# Patient Record
Sex: Female | Born: 1975 | Race: White | Hispanic: No | Marital: Married | Smoking: Never smoker
Health system: Southern US, Community
[De-identification: ages and names within clinical notes are randomized; demographics above are authoritative.]

## PROBLEM LIST (undated history)

## (undated) ENCOUNTER — Inpatient Hospital Stay (HOSPITAL_COMMUNITY): Payer: PRIVATE HEALTH INSURANCE

## (undated) ENCOUNTER — Inpatient Hospital Stay (HOSPITAL_COMMUNITY): Payer: Self-pay

## (undated) DIAGNOSIS — R011 Cardiac murmur, unspecified: Secondary | ICD-10-CM

## (undated) DIAGNOSIS — O3660X0 Maternal care for excessive fetal growth, unspecified trimester, not applicable or unspecified: Secondary | ICD-10-CM

## (undated) DIAGNOSIS — N200 Calculus of kidney: Secondary | ICD-10-CM

## (undated) DIAGNOSIS — B999 Unspecified infectious disease: Secondary | ICD-10-CM

## (undated) HISTORY — DX: Unspecified infectious disease: B99.9

## (undated) HISTORY — PX: WISDOM TOOTH EXTRACTION: SHX21

## (undated) HISTORY — DX: Cardiac murmur, unspecified: R01.1

---

## 1997-05-10 DIAGNOSIS — R011 Cardiac murmur, unspecified: Secondary | ICD-10-CM

## 1997-05-10 HISTORY — DX: Cardiac murmur, unspecified: R01.1

## 2010-10-09 ENCOUNTER — Inpatient Hospital Stay (HOSPITAL_COMMUNITY): Admission: AD | Admit: 2010-10-09 | Payer: Self-pay | Source: Home / Self Care | Admitting: Obstetrics and Gynecology

## 2010-10-14 ENCOUNTER — Inpatient Hospital Stay (HOSPITAL_COMMUNITY)
Admission: AD | Admit: 2010-10-14 | Discharge: 2010-10-16 | DRG: 373 | Disposition: A | Discharge status: Home / Self Care | Payer: BC Managed Care – PPO | Source: Ambulatory Visit | Attending: Obstetrics and Gynecology | Admitting: Obstetrics and Gynecology

## 2010-10-14 DIAGNOSIS — D649 Anemia, unspecified: Secondary | ICD-10-CM | POA: Diagnosis not present

## 2010-10-14 DIAGNOSIS — O9903 Anemia complicating the puerperium: Secondary | ICD-10-CM | POA: Diagnosis not present

## 2010-10-15 LAB — CBC
Platelets: 226 10*3/uL (ref 150–400)
RBC: 3.31 MIL/uL — ABNORMAL LOW (ref 3.87–5.11)
RDW: 14.3 % (ref 11.5–15.5)
WBC: 10.9 10*3/uL — ABNORMAL HIGH (ref 4.0–10.5)

## 2010-10-15 LAB — RPR: RPR Ser Ql: NONREACTIVE

## 2010-10-16 LAB — CBC
Hemoglobin: 8.8 g/dL — ABNORMAL LOW (ref 12.0–15.0)
MCHC: 32.2 g/dL (ref 30.0–36.0)
Platelets: 220 10*3/uL (ref 150–400)
RBC: 2.86 MIL/uL — ABNORMAL LOW (ref 3.87–5.11)

## 2012-01-18 ENCOUNTER — Ambulatory Visit: Payer: Self-pay | Admitting: Obstetrics and Gynecology

## 2012-01-31 ENCOUNTER — Ambulatory Visit: Payer: Self-pay | Admitting: Obstetrics and Gynecology

## 2012-02-28 ENCOUNTER — Ambulatory Visit (INDEPENDENT_AMBULATORY_CARE_PROVIDER_SITE_OTHER): Payer: BC Managed Care – PPO | Admitting: Obstetrics and Gynecology

## 2012-02-28 ENCOUNTER — Other Ambulatory Visit: Payer: Self-pay

## 2012-02-28 ENCOUNTER — Telehealth: Payer: Self-pay | Admitting: Obstetrics and Gynecology

## 2012-02-28 ENCOUNTER — Other Ambulatory Visit: Payer: Self-pay | Admitting: Obstetrics and Gynecology

## 2012-02-28 ENCOUNTER — Encounter: Payer: Self-pay | Admitting: Obstetrics and Gynecology

## 2012-02-28 VITALS — BP 110/68 | Ht 67.0 in | Wt 163.0 lb

## 2012-02-28 DIAGNOSIS — Z0000 Encounter for general adult medical examination without abnormal findings: Secondary | ICD-10-CM

## 2012-02-28 DIAGNOSIS — Z331 Pregnant state, incidental: Secondary | ICD-10-CM

## 2012-02-28 DIAGNOSIS — O09529 Supervision of elderly multigravida, unspecified trimester: Secondary | ICD-10-CM

## 2012-02-28 DIAGNOSIS — B373 Candidiasis of vulva and vagina: Secondary | ICD-10-CM

## 2012-02-28 DIAGNOSIS — Z01419 Encounter for gynecological examination (general) (routine) without abnormal findings: Secondary | ICD-10-CM

## 2012-02-28 DIAGNOSIS — B3731 Acute candidiasis of vulva and vagina: Secondary | ICD-10-CM

## 2012-02-28 MED ORDER — TERCONAZOLE 0.4 % VA CREA: 1.0000 | TOPICAL_CREAM | Freq: Every day | VAGINAL | Status: DC

## 2012-02-28 NOTE — Progress Notes (Signed)
Last Pap:  Over 2 years ago per pt WNL: Yes Regular Periods:yes Contraception: pregnant   Monthly Breast exam:yes Tetanus<70yrs:no Nl.Bladder Function:yes Daily BMs:yes Healthy Diet:yes Calcium:yes Mammogram:no Exercise:yes Have often Exercise: 3 times a week  Seatbelt: yes Abuse at home: no Stressful work:no  Bone Density: No PCP: urgent care  Change in PMH: unchanged  Change in ZOX:[id number]

## 2012-02-28 NOTE — Progress Notes (Signed)
Subjective:    Marie Woodward is a 36 y.o. female, G3P2002, who presents for an annual exam. Planning pg soon hx of IVF x 2 now with +UPT with LMP certain 8/30, taking PNV, without N,V, or breast tenderness.    History   Social History  . Marital Status: Married    Spouse Name: N/A    Number of Children: N/A  . Years of Education: N/A   Social History Main Topics  . Smoking status: Never Smoker   . Smokeless tobacco: Never Used  . Alcohol Use: No  . Drug Use: No  . Sexually Active: Yes    Birth Control/ Protection: None     pregnant    Other Topics Concern  . None   Social History Narrative  . None    Menstrual cycle:   LMP:8/30 certain  The following portions of the patient's history were reviewed and updated as appropriate: allergies, current medications, past family history, past medical history, past social history, past surgical history and problem list.  Review of Systems Pertinent items are noted in HPI. Breast:Negative for breast lump,nipple discharge or nipple retraction Gastrointestinal: Negative for abdominal pain, change in bowel habits or rectal bleeding Urinary:negative   Objective:    BP 110/68  Ht 5\' 7"  (1.702 m)  Wt 163 lb (73.936 kg)  BMI 25.53 kg/m2  LMP 01/07/2012    Weight:  Wt Readings from Last 1 Encounters:  02/28/12 163 lb (73.936 kg)          BMI: Body mass index is 25.53 kg/(m^2).  General Appearance: Alert, appropriate appearance for age. No acute distress HEENT: Grossly normal Neck / Thyroid: Supple, no masses, nodes or enlargement Lungs: clear to auscultation bilaterally Back: No CVA tenderness Breast Exam: No masses or nodes.No dimpling, nipple retraction or discharge Cardiovascular: Regular rate and rhythm. Gastrointestinal: Soft, non-tender, no masses or organomegaly Pelvic Exam: EGBUS WNL uterus 6 week size neg CMT no adenexal masses  Rectovaginal: rectum visible wnl Lymphatic Exam: Non-palpable nodes in neck, clavicular,  axillary, or inguinal regions Skin: no rash or abnormalities Neurologic: Normal gait and speech, no tremor  Psychiatric: Alert and oriented, appropriate affect.   Wet Prep:positive hyphae, neg clue, neg trich Urinalysis:not applicable UPT: Positive, Not done   Assessment:    Normal gyn exam  6 week pg   VVC  Plan:  pap smear return for new ob visit with RN STD screening: gc/chl today Declines genetic testing discussed Ama  Lavera Guise, CNM

## 2012-02-28 NOTE — Telephone Encounter (Signed)
Returned pt's call. Informed of MK (per DR SR) recommendation of beginning progesterone supp.  Pt prefers to wait for results of lab.  Sched 03/01/12.

## 2012-02-28 NOTE — Telephone Encounter (Signed)
TC to pt. LM to return call. Per MK, Rx for Progesterone 25 mg 1 pv BID #14, 3 RF to be ordered. Will notify pt if needs to continue after progesterone lab result is available.

## 2012-02-29 LAB — PAP IG, CT-NG, RFX HPV ASCU: Chlamydia Probe Amp: NEGATIVE

## 2012-03-01 ENCOUNTER — Other Ambulatory Visit: Payer: BC Managed Care – PPO

## 2012-03-01 DIAGNOSIS — Z331 Pregnant state, incidental: Secondary | ICD-10-CM

## 2012-03-08 ENCOUNTER — Telehealth: Payer: Self-pay | Admitting: Obstetrics and Gynecology

## 2012-03-08 NOTE — Telephone Encounter (Signed)
TC to pt. LM to return call.  

## 2012-03-08 NOTE — Telephone Encounter (Signed)
Message copied by Mason Jim on Wed Mar 08, 2012 10:38 AM ------      Message from: [facility], Corrie Dandy      Created: Wed Mar 08, 2012  8:47 AM      Regarding: call pt progestrone level is above 15 will not need to use progesterone suppositories       Thanks, Corrie Dandy

## 2012-03-08 NOTE — Telephone Encounter (Signed)
TC from pt.  Informed per [facility] Progesterone level above 15.  Does not need Prog supp. Pt verbalizes comprehension.

## 2012-04-11 ENCOUNTER — Ambulatory Visit (INDEPENDENT_AMBULATORY_CARE_PROVIDER_SITE_OTHER): Payer: BC Managed Care – PPO | Admitting: Obstetrics and Gynecology

## 2012-04-11 DIAGNOSIS — Z331 Pregnant state, incidental: Secondary | ICD-10-CM

## 2012-04-11 LAB — POCT URINALYSIS DIPSTICK
Bilirubin, UA: NEGATIVE
Blood, UA: NEGATIVE
Glucose, UA: NEGATIVE
Ketones, UA: NEGATIVE
Nitrite, UA: NEGATIVE
Spec Grav, UA: <=1.005
pH, UA: 8

## 2012-04-11 NOTE — Progress Notes (Signed)
NOB interview.  Declines genetic testing.

## 2012-04-13 LAB — CULTURE, OB URINE: Colony Count: 60000

## 2012-04-14 LAB — PRENATAL PANEL VII
Antibody Screen: NEGATIVE
Eosinophils Absolute: 0 10*3/uL (ref 0.0–0.7)
Eosinophils Relative: 1 % (ref 0–5)
HCT: 39.3 % (ref 36.0–46.0)
Lymphocytes Relative: 21 % (ref 12–46)
Lymphs Abs: 1.4 10*3/uL (ref 0.7–4.0)
MCH: 31.1 pg (ref 26.0–34.0)
MCV: 94 fL (ref 78.0–100.0)
Monocytes Absolute: 0.4 10*3/uL (ref 0.1–1.0)
Platelets: 247 10*3/uL (ref 150–400)
RBC: 4.18 MIL/uL (ref 3.87–5.11)
Rh Type: POSITIVE
Rubella: 1.64 Index — ABNORMAL HIGH (ref <0.90)
WBC: 6.9 10*3/uL (ref 4.0–10.5)

## 2012-04-17 ENCOUNTER — Ambulatory Visit (INDEPENDENT_AMBULATORY_CARE_PROVIDER_SITE_OTHER): Payer: BC Managed Care – PPO | Admitting: Obstetrics and Gynecology

## 2012-04-17 ENCOUNTER — Encounter: Payer: Self-pay | Admitting: Obstetrics and Gynecology

## 2012-04-17 VITALS — BP 100/58 | Wt 162.5 lb

## 2012-04-17 DIAGNOSIS — Z23 Encounter for immunization: Secondary | ICD-10-CM

## 2012-04-17 DIAGNOSIS — B951 Streptococcus, group B, as the cause of diseases classified elsewhere: Secondary | ICD-10-CM

## 2012-04-17 DIAGNOSIS — O2390 Unspecified genitourinary tract infection in pregnancy, unspecified trimester: Secondary | ICD-10-CM

## 2012-04-17 DIAGNOSIS — O2340 Unspecified infection of urinary tract in pregnancy, unspecified trimester: Secondary | ICD-10-CM

## 2012-04-17 DIAGNOSIS — N390 Urinary tract infection, site not specified: Secondary | ICD-10-CM

## 2012-04-17 DIAGNOSIS — Z331 Pregnant state, incidental: Secondary | ICD-10-CM

## 2012-04-17 MED ORDER — FLUCONAZOLE 150 MG PO TABS: 150.0000 mg | ORAL_TABLET | Freq: Once | ORAL | Status: DC

## 2012-04-17 MED ORDER — PENICILLIN V POTASSIUM 500 MG PO TABS: 500.0000 mg | ORAL_TABLET | Freq: Three times a day (TID) | ORAL | Status: DC

## 2012-04-17 NOTE — Progress Notes (Signed)
[redacted]w[redacted]d Declines genetic screening  Last pap with GC/CT 02/28/12 WNL  Pt will get flu vaccine NV

## 2012-04-20 DIAGNOSIS — Z23 Encounter for immunization: Secondary | ICD-10-CM | POA: Insufficient documentation

## 2012-04-20 NOTE — Progress Notes (Signed)
Patient ID: Marie Woodward, female   DOB: [birth year], 36 y.o.   MRN: [id number] Marie Woodward is a 35 y.o. female, G3P2002, who presents for an annual exam. Planning pg soon hx of IVF x 2 now with +UPT with LMP certain 8/30, taking PNV, without N,V, or breast tenderness Marie Woodward is a 36 y.o. female presenting for new ob visit. Taking PNV. Declines genetic test. [redacted]w[redacted]d   OB History    Grav Para Term Preterm Abortions TAB SAB Ect Mult Living   3 2 2       2     HX SVD 7#14 IVF uncomplicated HX SVD 9#2 IVF water birth uncomplicated Past Medical History  Diagnosis Date  . Heart murmur 1999    HAD CARDIAC EVAL  . Infection     UTI X1  . Infection     OCC YEAST  . Headache     RARE MIGRAINES   Past Surgical History  Procedure Date  . Wisdom tooth extraction    Family History: family history includes Cancer in her paternal grandmother; Depression in her father and paternal grandfather; Diabetes in her father; Early death (age of onset:50) in her mother; Heart disease in her maternal grandfather; Heart disease (age of onset:50) in her mother; Hyperlipidemia in her brother; Hypertension in her mother; and Kidney disease in her father. Social History:  reports that she has never smoked. She has never used smokeless tobacco. She reports that she does not drink alcohol or use illicit drugs.  @ROS @    Blood pressure 100/58, weight 162 lb 8 oz (73.71 kg), last menstrual period 01/07/2012, unknown if currently breastfeeding. Complete PE on 02/2012 WNL  Prenatal labs: ABO, Rh: O/POS/-- (12/03 0916) Antibody: NEG (12/03 0916) Rubella:   RPR: NON REAC (12/03 0916)  HBsAg: NEGATIVE (12/03 0916)  HIV: NON REACTIVE (12/03 0916)    Assessment/Plan: [redacted]w[redacted]d GC/CHL neg/neg WET PREP treated yeast PAP 02/2012  ULTRASOUND none will plan 20 week US anatomy Genetic testing declined UA GBS + TXED will plan tx in labor discussed. Varicella nonimmune. Collaboration with Dr.  Su Hilt. [facility], Marie Woodward 04/20/2012, 4:53 PM Lavera Guise, CNM

## 2012-04-21 ENCOUNTER — Telehealth: Payer: Self-pay | Admitting: Obstetrics and Gynecology

## 2012-04-21 NOTE — Telephone Encounter (Signed)
TC to pt. Informed not immune to varicella.  To avoid contact with anyone who is sick and call ASAP with any known exposure. Pt verbalizes comprehension.

## 2012-04-21 NOTE — Telephone Encounter (Signed)
Message copied by Mason Jim on Fri Apr 21, 2012  9:48 AM ------      Message from: [facility], Corrie Dandy      Created: Thu Apr 20, 2012  5:00 PM      Regarding: varicella nonimmune       Please call varicella nonimmune willl need vaccine can not get vaccine during pregnancy avoid people who are sick.Lavera Guise, CNM

## 2012-05-10 NOTE — L&D Delivery Note (Signed)
Delivery Note At 4:07 PM a viable female was delivered via Vaginal, Spontaneous Delivery by RN in birth tub (Presentation: ; Occiput Anterior).  APGAR: 9, 9; weight 9 lb 3.6 oz (4184 g).   Placenta status: Intact, Spontaneous.  Cord: 3 vessels with the following complications: None.  Cord pH: not collected  Anesthesia: Local  Episiotomy: None Lacerations: 1st degree Suture Repair: 3.0 vicryl Est. Blood Loss (mL): 200  Mom to postpartum.  Baby to skin to skin immediately after delivery.  Haroldine Laws 10/05/2012, 6:15 PM

## 2012-05-22 ENCOUNTER — Encounter: Payer: Self-pay | Admitting: Obstetrics and Gynecology

## 2012-05-22 ENCOUNTER — Ambulatory Visit (INDEPENDENT_AMBULATORY_CARE_PROVIDER_SITE_OTHER): Payer: BC Managed Care – PPO | Admitting: Obstetrics and Gynecology

## 2012-05-22 VITALS — BP 98/52 | Wt 168.0 lb

## 2012-05-22 DIAGNOSIS — Z331 Pregnant state, incidental: Secondary | ICD-10-CM

## 2012-05-22 DIAGNOSIS — Z3689 Encounter for other specified antenatal screening: Secondary | ICD-10-CM

## 2012-05-22 DIAGNOSIS — Z3490 Encounter for supervision of normal pregnancy, unspecified, unspecified trimester: Secondary | ICD-10-CM

## 2012-05-22 NOTE — Progress Notes (Signed)
Doing well. Need anatomy US in 1-2 weeks.   Plans another waterbirth, deciding regarding use of doula (likely will use Viviana Simpler again). Declined genetic screening. Check urine culture TOC NV.

## 2012-05-22 NOTE — Addendum Note (Signed)
Addended by: Janeece Agee on: 05/22/2012 05:04 PM   Modules accepted: Orders

## 2012-05-22 NOTE — Progress Notes (Signed)
[redacted]w[redacted]d Pt declines flu vaccine. No concerns per pt.

## 2012-05-25 ENCOUNTER — Encounter: Payer: BC Managed Care – PPO | Admitting: Obstetrics and Gynecology

## 2012-05-30 ENCOUNTER — Other Ambulatory Visit: Payer: Self-pay | Admitting: Obstetrics and Gynecology

## 2012-05-30 ENCOUNTER — Ambulatory Visit: Payer: BC Managed Care – PPO | Admitting: Obstetrics and Gynecology

## 2012-05-30 ENCOUNTER — Ambulatory Visit: Payer: BC Managed Care – PPO

## 2012-05-30 ENCOUNTER — Encounter: Payer: Self-pay | Admitting: Obstetrics and Gynecology

## 2012-05-30 VITALS — BP 110/60 | Wt 168.0 lb

## 2012-05-30 DIAGNOSIS — O358XX0 Maternal care for other (suspected) fetal abnormality and damage, not applicable or unspecified: Secondary | ICD-10-CM

## 2012-05-30 DIAGNOSIS — IMO0002 Reserved for concepts with insufficient information to code with codable children: Secondary | ICD-10-CM

## 2012-05-30 DIAGNOSIS — Z3490 Encounter for supervision of normal pregnancy, unspecified, unspecified trimester: Secondary | ICD-10-CM

## 2012-05-30 DIAGNOSIS — Z3689 Encounter for other specified antenatal screening: Secondary | ICD-10-CM

## 2012-05-30 LAB — US OB DETAIL + 14 WK

## 2012-05-30 NOTE — Progress Notes (Signed)
[redacted]w[redacted]d Unable to void again. Pt left sample before u/s non sterile cup. Anatomy Ultrasound shows:  SIUP  S=D     [place] EDD: 10/13/12           AFI: normal fluid (vertical pocket = 3.9 cm)           Cervical length: 3.48 cm           Placenta localization: posterior           Placenta edge: 3.4 com from internal OS - normal            Fetal presentation: breech                    Anatomy survey is normal           Gender : female [place] pregnancy. No anomalies seen. 4 chamber 3VV, RVOT are unseen due to fetal position f/u in 2-3 wks to complete anat.

## 2012-05-30 NOTE — Progress Notes (Signed)
[redacted]w[redacted]d Pt feeling well. C/o hip discomfort - planning to see Chiropractor Dr. Manson Passey in [place] [employer] anatomy scan today -- will f/u at [facility]

## 2012-06-17 ENCOUNTER — Inpatient Hospital Stay (HOSPITAL_COMMUNITY)
Admission: AD | Admit: 2012-06-17 | Discharge: 2012-06-17 | Disposition: A | Discharge status: Home / Self Care | Payer: BC Managed Care – PPO | Source: Ambulatory Visit | Attending: Obstetrics and Gynecology | Admitting: Obstetrics and Gynecology

## 2012-06-17 ENCOUNTER — Encounter (HOSPITAL_COMMUNITY): Payer: Self-pay | Admitting: Obstetrics and Gynecology

## 2012-06-17 ENCOUNTER — Inpatient Hospital Stay (HOSPITAL_COMMUNITY): Payer: BC Managed Care – PPO

## 2012-06-17 DIAGNOSIS — O99891 Other specified diseases and conditions complicating pregnancy: Secondary | ICD-10-CM | POA: Insufficient documentation

## 2012-06-17 DIAGNOSIS — R319 Hematuria, unspecified: Secondary | ICD-10-CM | POA: Insufficient documentation

## 2012-06-17 DIAGNOSIS — R109 Unspecified abdominal pain: Secondary | ICD-10-CM | POA: Insufficient documentation

## 2012-06-17 LAB — URINALYSIS, ROUTINE W REFLEX MICROSCOPIC
Bilirubin Urine: NEGATIVE
Ketones, ur: NEGATIVE mg/dL
Specific Gravity, Urine: <1.005 — ABNORMAL LOW (ref 1.005–1.030)
Urobilinogen, UA: 0.2 mg/dL (ref 0.0–1.0)

## 2012-06-17 LAB — URINE MICROSCOPIC-ADD ON

## 2012-06-17 LAB — WET PREP, GENITAL
Clue Cells Wet Prep HPF POC: NONE SEEN
Yeast Wet Prep HPF POC: NONE SEEN

## 2012-06-17 LAB — OB RESULTS CONSOLE GC/CHLAMYDIA: Chlamydia: NEGATIVE

## 2012-06-17 NOTE — MAU Provider Note (Signed)
History   CSN: [id number]  Arrival date and time: 06/17/12 1411  First Provider Initiated Contact with Patient 06/17/12 1549      Chief Complaint  Patient presents with  . Urinary Tract Infection   HPI Pt is a 36yo G3 P2,0,0,2 who presents at 23.[redacted]wks gestation with c/o possible UTI.  States she began having lower abd cramping as well as lower back pain last night which has intensified this AM.  She also reports pain in her upper thighs.  Beginning this AM, she noted dysuria, frequency and urgency.  Pt denies any UCs, ROM or bldg.  She is noting normal fetal movement.  She denies any fever, nausea, vomiting.  She reports she has been txed x 1 earlier in preg due to GBS bacteruia, but no s/s since and no hx of freq UTIs or renal lithiasis in the past.  Her pregnancy has been otherwise unremarkable. Ranks pain as 3/10 on scale.  OB History   Grav Para Term Preterm Abortions TAB SAB Ect Mult Living   3 2 2       2       Past Medical History  Diagnosis Date  . Heart murmur 1999    HAD CARDIAC EVAL  . Infection     UTI X1  . Infection     OCC YEAST  . Headache     RARE MIGRAINES    Past Surgical History  Procedure Laterality Date  . Wisdom tooth extraction      Family History  Problem Relation Age of Onset  . Heart disease Mother 53  . Early death Mother 41  . Hypertension Mother   . Diabetes Father     type 2   . Depression Father   . Kidney disease Father     STONES  . Heart disease Maternal Grandfather   . Cancer Paternal Grandmother   . Depression Paternal Grandfather     type 2   . Hyperlipidemia Brother     History  Substance Use Topics  . Smoking status: Never Smoker   . Smokeless tobacco: Never Used  . Alcohol Use: No    Allergies: No Known Allergies  Prescriptions prior to admission  Medication Sig Dispense Refill  . acetaminophen (TYLENOL) 500 MG tablet Take 500 mg by mouth every 6 (six) hours as needed for pain (pain).      . Prenatal Vit-Fe  Fumarate-FA (PRENATAL MULTIVITAMIN) TABS Take 1 tablet by mouth daily.        Review of Systems  Constitutional: Negative.   HENT: Negative.   Eyes: Negative.   Respiratory: Negative.   Cardiovascular: Negative.   Gastrointestinal: Negative.   Genitourinary: Positive for dysuria, urgency and frequency.  Musculoskeletal: Negative.   Skin: Negative.   Neurological: Negative.   Endo/Heme/Allergies: Negative.   Psychiatric/Behavioral: Negative.    Physical Exam   Blood pressure 125/71, pulse 74, temperature 98.2 F (36.8 C), temperature source Oral, resp. rate 18, height 5\' 6"  (1.676 m), weight 176 lb (79.833 kg), last menstrual period 01/07/2012.  Physical Exam  Constitutional: She is oriented to person, place, and time. She appears well-developed and well-nourished.  HENT:  Head: Normocephalic and atraumatic.  Right Ear: External ear normal.  Left Ear: External ear normal.  Nose: Nose normal.  Eyes: Conjunctivae are normal. Pupils are equal, round, and reactive to light.  Neck: Normal range of motion. Neck supple. No thyromegaly present.  Cardiovascular: Normal rate, regular rhythm and intact distal pulses.   Murmur heard. Gr  III/VI murmur noted.  Respiratory: Effort normal and breath sounds normal.  GI: Soft. Bowel sounds are normal.  Genitourinary: Uterus normal. Vaginal discharge found.  Ext genitalia, BUS neg.  Mod amt white discharge in vault. Cx visually closed.  Neg CMT.  Ut mobile, NT, soft and c/w gest age. SVE: Closed/Thick/-3.     Musculoskeletal: Normal range of motion.  Neg CVAT bilat  Neurological: She is alert and oriented to person, place, and time. She has normal reflexes.  Skin: Skin is warm and dry.  Psychiatric: She has a normal mood and affect. Her behavior is normal. Thought content normal.   FHTs present with doppler.  Toco without any uterine contractions or uterine irritability noted.   MAU Course  Procedures Results for orders placed during the  hospital encounter of 06/17/12 (from the past 24 hour(s))  URINALYSIS, ROUTINE W REFLEX MICROSCOPIC     Status: Abnormal   Collection Time    06/17/12  2:28 PM      Result Value Range   Color, Urine YELLOW  YELLOW   APPearance CLEAR  CLEAR   Specific Gravity, Urine <1.005 (*) 1.005 - 1.030   pH 7.0  5.0 - 8.0   Glucose, UA NEGATIVE  NEGATIVE mg/dL   Hgb urine dipstick LARGE (*) NEGATIVE   Bilirubin Urine NEGATIVE  NEGATIVE   Ketones, ur NEGATIVE  NEGATIVE mg/dL   Protein, ur NEGATIVE  NEGATIVE mg/dL   Urobilinogen, UA 0.2  0.0 - 1.0 mg/dL   Nitrite NEGATIVE  NEGATIVE   Leukocytes, UA NEGATIVE  NEGATIVE  URINE MICROSCOPIC-ADD ON     Status: Abnormal   Collection Time    06/17/12  2:28 PM      Result Value Range   Squamous Epithelial / LPF FEW (*) RARE   WBC, UA 0-2  <3 WBC/hpf   RBC / HPF 0-2  <3 RBC/hpf   Urine-Other MUCOUS PRESENT    WET PREP, GENITAL     Status: Abnormal   Collection Time    06/17/12  3:55 PM      Result Value Range   Yeast Wet Prep HPF POC NONE SEEN  NONE SEEN   Trich, Wet Prep NONE SEEN  NONE SEEN   Clue Cells Wet Prep HPF POC NONE SEEN  NONE SEEN   WBC, Wet Prep HPF POC TOO NUMEROUS TO COUNT (*) NONE SEEN  FETAL FIBRONECTIN     Status: None   Collection Time    06/17/12  3:55 PM      Result Value Range   Fetal Fibronectin NEGATIVE  NEGATIVE    Assessment and Plan  IUP at 23w 1d Abd pain Microscopic hematuria - suspected renal lithiasis.  Consult with Dr. Su Hilt. Will obtain renal ultrasound. Pt declines any pain medications at present.  Macy Polio O. 06/17/2012, 6:38 PM   Addendum:  Pt returned from ultrasound and reports pain now greatly decreased.  Denies any further dysuria at present. Ultrasound with 7mm suspected calculi in bladder which was no longer present after voiding.  Pt discharged to home with info on renal calculi given. Will RTO as scheduled for next prenatal visit at [facility]. Pt to RTO or call if she has recurrent pain, fever  or other complaints.    Halen Mossbarger O. 06/17/2012, 7:45 PM

## 2012-06-17 NOTE — MAU Note (Signed)
Pt presents with complaints of cramping since 5 am. States she started having pain with urination around noon

## 2012-06-18 ENCOUNTER — Encounter (HOSPITAL_COMMUNITY): Payer: Self-pay | Admitting: Obstetrics and Gynecology

## 2012-06-20 LAB — GC/CHLAMYDIA PROBE AMP
CT Probe RNA: NEGATIVE
GC Probe RNA: NEGATIVE

## 2012-06-27 ENCOUNTER — Ambulatory Visit: Payer: BC Managed Care – PPO

## 2012-06-27 ENCOUNTER — Other Ambulatory Visit: Payer: BC Managed Care – PPO

## 2012-06-27 ENCOUNTER — Ambulatory Visit: Payer: BC Managed Care – PPO | Admitting: Obstetrics and Gynecology

## 2012-06-27 VITALS — BP 110/70 | Wt 174.0 lb

## 2012-06-27 DIAGNOSIS — N200 Calculus of kidney: Secondary | ICD-10-CM | POA: Insufficient documentation

## 2012-06-27 DIAGNOSIS — O358XX0 Maternal care for other (suspected) fetal abnormality and damage, not applicable or unspecified: Secondary | ICD-10-CM

## 2012-06-27 LAB — US OB FOLLOW UP

## 2012-06-27 NOTE — Progress Notes (Signed)
Doing well-seen in MAU on 2/8 for kidney stone.  No further issues, had resolution of previous lower abdominal pain that she had thought was ligament pain, but resolved after passing of stone. [place] today, with completion of anatomy.  Cervical length WNL, growth 62%ile, variable presentation--eventually to vtx. Glucola NV, with Hgb and RPR. Hip discomfort stable.

## 2012-06-27 NOTE — Progress Notes (Signed)
[redacted]w[redacted]d No concerns per pt  Ultrasound shows:  SIUP  S=D     [place] EDD: 10/13/12            AFI: normal fluid AP pocket 5.0 cm           Cervical length: 3.85 cm           Placenta localization: posterior           Fetal presentation: variable           EFW: = 62nd%tile                   Anatomy survey is normal           Gender : female

## 2012-07-02 ENCOUNTER — Encounter (HOSPITAL_COMMUNITY): Payer: Self-pay | Admitting: Obstetrics and Gynecology

## 2012-07-02 ENCOUNTER — Inpatient Hospital Stay (HOSPITAL_COMMUNITY)
Admission: AD | Admit: 2012-07-02 | Discharge: 2012-07-02 | Disposition: A | Discharge status: Home / Self Care | Payer: BC Managed Care – PPO | Source: Ambulatory Visit | Attending: Obstetrics and Gynecology | Admitting: Obstetrics and Gynecology

## 2012-07-02 DIAGNOSIS — M545 Low back pain, unspecified: Secondary | ICD-10-CM | POA: Insufficient documentation

## 2012-07-02 DIAGNOSIS — O26839 Pregnancy related renal disease, unspecified trimester: Secondary | ICD-10-CM | POA: Insufficient documentation

## 2012-07-02 DIAGNOSIS — N200 Calculus of kidney: Secondary | ICD-10-CM | POA: Insufficient documentation

## 2012-07-02 LAB — URINALYSIS, ROUTINE W REFLEX MICROSCOPIC
Bilirubin Urine: NEGATIVE
Specific Gravity, Urine: 1.015 (ref 1.005–1.030)
pH: 7 (ref 5.0–8.0)

## 2012-07-02 LAB — WET PREP, GENITAL: Yeast Wet Prep HPF POC: NONE SEEN

## 2012-07-02 MED ORDER — IBUPROFEN 800 MG PO TABS: 800.0000 mg | ORAL_TABLET | Freq: Once | ORAL | Status: DC

## 2012-07-02 NOTE — MAU Provider Note (Signed)
History   Marie Woodward is a 37y.o. MWF at [redacted]w[redacted]d who called just before lunch w/ CC of what she thought was vaginal bleeding this morning.  Only other c/o's were mild, low back pain since yesterday, and intermittent pelvic/suprapubic pain at times.  Pt reported she had done extra housework yesterday, and initially attributed back pain to that, but started having brown spotting on underwear this morning, and some BRB w/ wiping, which prompted her to call.  Denied abdominal tightening and ctxs.  Denies resp or other GI c/o's w/ exception of significant heartburn (takes TUMS). No recent IC.  Denies fever or chills.  Accompanied by her husband.  No itching or burning and no dysuria, but frequency.  Of significance, pt seen in MAU 06/17/12 for kidney stone and 7mm calculus noted on initial renal u/s in bladder, but rescanned after void and calculus no longer seen.  Pt was thought to have passed stone, and no further f/u indicated. Pt also stated felt better at that time.  .. Patient Active Problem List  Diagnosis  . Annual physical exam  . Normal pregnancy, incidental  . AMA (advanced maternal age) multigravida 35+  . GBS (group B streptococcus) UTI complicating pregnancy  . Need for varicella vaccine  . Kidney stone   CSN: [id number]  Arrival date and time: 07/02/12 1204   None     Chief Complaint  Patient presents with  . Vaginal Bleeding  . Back Pain   HPI  OB History   Grav Para Term Preterm Abortions TAB SAB Ect Mult Living   3 2 2       2       Past Medical History  Diagnosis Date  . Heart murmur 1999    HAD CARDIAC EVAL  . Infection     UTI X1  . Infection     OCC YEAST  . Headache     RARE MIGRAINES    Past Surgical History  Procedure Laterality Date  . Wisdom tooth extraction      Family History  Problem Relation Age of Onset  . Heart disease Mother 77  . Early death Mother 21  . Hypertension Mother   . Diabetes Father     type 2   . Depression Father   .  Kidney disease Father     STONES  . Heart disease Maternal Grandfather   . Cancer Paternal Grandmother   . Diabetes Paternal Grandfather     Type 2  . Hyperlipidemia Brother     History  Substance Use Topics  . Smoking status: Never Smoker   . Smokeless tobacco: Never Used  . Alcohol Use: No    Allergies: No Known Allergies  Prescriptions prior to admission  Medication Sig Dispense Refill  . acetaminophen (TYLENOL) 500 MG tablet Take 500 mg by mouth every 6 (six) hours as needed for pain (pain).      . Prenatal Vit-Fe Fumarate-FA (PRENATAL MULTIVITAMIN) TABS Take 1 tablet by mouth daily.        ROS--see HPI Physical Exam   Blood pressure 119/71, pulse 84, temperature 97.6 F (36.4 C), temperature source Oral, resp. rate 16, height 5\' 6"  (1.676 m), weight 172 lb 9.6 oz (78.291 kg), last menstrual period 01/07/2012, unknown if currently breastfeeding.  Physical Exam  Constitutional: She is oriented to person, place, and time. She appears well-developed and well-nourished. No distress.  HENT:  Head: Normocephalic and atraumatic.  Eyes: Pupils are equal, round, and reactive to light.  Cardiovascular:  Normal rate.   Respiratory: Effort normal.  GI: Soft.  Gravid; no ctxs palpated; soft, NT  Genitourinary:  Inspection: small amt of watery BRB noted at urethral meatus and periurethral area; small amt also noted at introitus and lesser amt on vulva. SSE: no blood in vault or at os.  Small amt of white, nonodorous d/c in vagina.  No lesions. Cx: closed/long/high  Musculoskeletal: She exhibits no edema.  Neurological: She is alert and oriented to person, place, and time.  Skin: Skin is warm and dry.  Psychiatric: She has a normal mood and affect. Her behavior is normal. Judgment and thought content normal.  .. Results for orders placed during the hospital encounter of 07/02/12 (from the past 24 hour(s))  URINALYSIS, ROUTINE W REFLEX MICROSCOPIC     Status: Abnormal    Collection Time    07/02/12 12:22 PM      Result Value Range   Color, Urine YELLOW  YELLOW   APPearance HAZY (*) CLEAR   Specific Gravity, Urine 1.015  1.005 - 1.030   pH 7.0  5.0 - 8.0   Glucose, UA NEGATIVE  NEGATIVE mg/dL   Hgb urine dipstick LARGE (*) NEGATIVE   Bilirubin Urine NEGATIVE  NEGATIVE   Ketones, ur NEGATIVE  NEGATIVE mg/dL   Protein, ur NEGATIVE  NEGATIVE mg/dL   Urobilinogen, UA 0.2  0.0 - 1.0 mg/dL   Nitrite NEGATIVE  NEGATIVE   Leukocytes, UA TRACE (*) NEGATIVE  URINE MICROSCOPIC-ADD ON     Status: Abnormal   Collection Time    07/02/12 12:22 PM      Result Value Range   Squamous Epithelial / LPF FEW (*) RARE   WBC, UA 3-6  <3 WBC/hpf   RBC / HPF 11-20  <3 RBC/hpf   Bacteria, UA RARE  RARE  WET PREP, GENITAL     Status: Abnormal   Collection Time    07/02/12  1:10 PM      Result Value Range   Yeast Wet Prep HPF POC NONE SEEN  NONE SEEN   Trich, Wet Prep NONE SEEN  NONE SEEN   Clue Cells Wet Prep HPF POC NONE SEEN  NONE SEEN   WBC, Wet Prep HPF POC FEW (*) NONE SEEN    MAU Course  Procedures 1. U/a 2. Urine cx 3. NST: FHR 135, one 15x15 accel, moderate variability, no decels; TOCO: no apparent ctxs 4. SSE w/ wet prep  Assessment and Plan  1. [redacted]w[redacted]d 2. Presumptive kidney stone (primary vs recurrent) 3. FHT appropriate for GA 4. No s/s of VB, or PTL 5. O pos  1. D/c home per c/w Dr. Estanislado Pandy w/ pyelo precautions and strainer 2. Will have office coordinate urology referral STAT 3. F/u as scheduled at [facility], or prn 4. Rec'd pt take Motrin 600mg  poq 6hrs for mild pain prn  Aramis Zobel H 07/02/2012, 1:44 PM

## 2012-07-02 NOTE — MAU Note (Signed)
Patient presents to MAU as a G3P2, [redacted]w[redacted]d, with c/o vaginal bleeding noted in underwear earlier today; reports it was brown spotting and now is red. Reports back pain since 2000 last night. Has not taken anything for back pain.  Denies antecedent intercourse or trauma, although reports she cleaned house yesterday and was exhausted afterward, noting back pain then.

## 2012-07-02 NOTE — MAU Note (Signed)
"  I noticed spotting about an hour ago.  I also started having low back pain as well.  I wasn't sure if the low back pain was from me having a previous kidney stone, if I was just passing another one.  I have been on my feet today at church, so I'm not sure if that is what made this start.  Lots of (+) FM.  No LOF.  The spotting seems like it is picking up."

## 2012-07-03 ENCOUNTER — Telehealth: Payer: Self-pay | Admitting: Obstetrics and Gynecology

## 2012-07-03 NOTE — Telephone Encounter (Signed)
Message copied by Mason Jim on Mon Jul 03, 2012  8:40 AM ------      Message from: Antonietta Breach      Created: Sun Jul 02, 2012  4:20 PM      Regarding: Urology referral       Pt seen in MAU for hematuria w/ h/o kidney stone =58mm on 06/17/12 on renal u/s.  Thought had passed stone that day in MAU, however, w/ amt of blood and low back pain, still present, or another stone has developed.        Please refer pt to urologist outpatient STAT, and pt can be called at [phone].  She is [redacted]w[redacted]d and her 3rd baby.            Thanks,      Skeet Simmer ------

## 2012-07-03 NOTE — Telephone Encounter (Signed)
Pt scheduled for Dr Annabell Howells at [facility] today at 1:00. TC to pt. Notified and appt and also that she will have to pay $250 toward deductible if has not been met. Pt verbalizes comprehension. States +FM. States is doing OK. Pain comes and goes.

## 2012-07-04 LAB — URINE CULTURE
Colony Count: 15000
Special Requests: NORMAL

## 2012-07-26 ENCOUNTER — Other Ambulatory Visit: Payer: Self-pay | Admitting: Obstetrics and Gynecology

## 2012-07-26 ENCOUNTER — Ambulatory Visit: Payer: BC Managed Care – PPO | Admitting: Certified Nurse Midwife

## 2012-07-26 VITALS — BP 102/58 | Wt 182.0 lb

## 2012-07-26 DIAGNOSIS — Z331 Pregnant state, incidental: Secondary | ICD-10-CM

## 2012-07-26 NOTE — Progress Notes (Signed)
Pt stated no issues today. GTT today given @ 3:50     Draw @  4:50

## 2012-07-26 NOTE — Progress Notes (Signed)
Pt. Is without complaints. No current problems with kidney stones. Fetus very active. 28 w & 5 days No S & S labor (PT) Abd.-soft and NT, F H 29.5 Family supportive  Discussion: Plans repeat water birth-Sign consent at next visit.          Balanced diet          Increase non caffeine beverages          Reinforced PTL S & S          1 hour gtt , rpr & hgb.          ROB in two weeks C. Camay Pedigo CNM, FNP

## 2012-07-27 LAB — HEMOGLOBIN: Hemoglobin: 11 g/dL — ABNORMAL LOW (ref 12.0–15.0)

## 2012-07-27 LAB — GLUCOSE TOLERANCE, 1 HOUR (50G) W/O FASTING: Glucose, 1 Hour GTT: 119 mg/dL (ref 70–140)

## 2012-07-27 LAB — RPR

## 2012-10-04 ENCOUNTER — Encounter (HOSPITAL_COMMUNITY): Payer: Self-pay | Admitting: *Deleted

## 2012-10-04 ENCOUNTER — Inpatient Hospital Stay (HOSPITAL_COMMUNITY)
Admission: AD | Admit: 2012-10-04 | Discharge: 2012-10-06 | DRG: 372 | Disposition: A | Discharge status: Home / Self Care | Payer: BC Managed Care – PPO | Source: Ambulatory Visit | Attending: Obstetrics and Gynecology | Admitting: Obstetrics and Gynecology

## 2012-10-04 DIAGNOSIS — O4290 Premature rupture of membranes, unspecified as to length of time between rupture and onset of labor, unspecified weeks of gestation: Principal | ICD-10-CM | POA: Diagnosis present

## 2012-10-04 DIAGNOSIS — Z22330 Carrier of Group B streptococcus: Secondary | ICD-10-CM

## 2012-10-04 DIAGNOSIS — O09529 Supervision of elderly multigravida, unspecified trimester: Secondary | ICD-10-CM | POA: Diagnosis present

## 2012-10-04 DIAGNOSIS — O99892 Other specified diseases and conditions complicating childbirth: Secondary | ICD-10-CM | POA: Diagnosis present

## 2012-10-04 HISTORY — DX: Maternal care for excessive fetal growth, unspecified trimester, not applicable or unspecified: O36.60X0

## 2012-10-04 LAB — CBC
Hemoglobin: 11.5 g/dL — ABNORMAL LOW (ref 12.0–15.0)
MCH: 30.5 pg (ref 26.0–34.0)
MCV: 94.7 fL (ref 78.0–100.0)
Platelets: 232 10*3/uL (ref 150–400)
RBC: 3.77 MIL/uL — ABNORMAL LOW (ref 3.87–5.11)
WBC: 10.1 10*3/uL (ref 4.0–10.5)

## 2012-10-04 MED ORDER — LACTATED RINGERS IV SOLN: 500.0000 mL | INTRAVENOUS | Status: DC | PRN

## 2012-10-04 MED ORDER — PENICILLIN G POTASSIUM 5000000 UNITS IJ SOLR
2.5000 10*6.[IU] | INTRAVENOUS | Status: DC
  Administered 2012-10-04 – 2012-10-05 (×4): 2.5 10*6.[IU] via INTRAVENOUS
  Filled 2012-10-04 (×8): qty 2.5

## 2012-10-04 MED ORDER — LACTATED RINGERS IV SOLN
INTRAVENOUS | Status: DC
  Administered 2012-10-04: 19:00:00 via INTRAVENOUS

## 2012-10-04 MED ORDER — OXYTOCIN BOLUS FROM INFUSION: 500.0000 mL | INTRAVENOUS | Status: DC

## 2012-10-04 MED ORDER — SODIUM CHLORIDE 0.9 % IJ SOLN: 3.0000 mL | Freq: Two times a day (BID) | INTRAMUSCULAR | Status: DC

## 2012-10-04 MED ORDER — CITRIC ACID-SODIUM CITRATE 334-500 MG/5ML PO SOLN: 30.0000 mL | ORAL | Status: DC | PRN

## 2012-10-04 MED ORDER — OXYCODONE-ACETAMINOPHEN 5-325 MG PO TABS: 1.0000 | ORAL_TABLET | ORAL | Status: DC | PRN

## 2012-10-04 MED ORDER — SODIUM CHLORIDE 0.9 % IV SOLN: 250.0000 mL | INTRAVENOUS | Status: DC | PRN

## 2012-10-04 MED ORDER — OXYTOCIN 40 UNITS IN LACTATED RINGERS INFUSION - SIMPLE MED: 62.5000 mL/h | INTRAVENOUS | Status: DC

## 2012-10-04 MED ORDER — PENICILLIN G POTASSIUM 5000000 UNITS IJ SOLR
5.0000 10*6.[IU] | Freq: Once | INTRAVENOUS | Status: AC
  Administered 2012-10-04: 5 10*6.[IU] via INTRAVENOUS
  Filled 2012-10-04: qty 5

## 2012-10-04 MED ORDER — ACETAMINOPHEN 325 MG PO TABS: 650.0000 mg | ORAL_TABLET | ORAL | Status: DC | PRN

## 2012-10-04 MED ORDER — IBUPROFEN 600 MG PO TABS: 600.0000 mg | ORAL_TABLET | Freq: Four times a day (QID) | ORAL | Status: DC | PRN

## 2012-10-04 MED ORDER — FENTANYL CITRATE 0.05 MG/ML IJ SOLN: 100.0000 ug | INTRAMUSCULAR | Status: DC | PRN

## 2012-10-04 MED ORDER — ONDANSETRON HCL 4 MG/2ML IJ SOLN: 4.0000 mg | Freq: Four times a day (QID) | INTRAMUSCULAR | Status: DC | PRN

## 2012-10-04 MED ORDER — LIDOCAINE HCL (PF) 1 % IJ SOLN
30.0000 mL | INTRAMUSCULAR | Status: DC | PRN
  Filled 2012-10-04 (×2): qty 30

## 2012-10-04 MED ORDER — SODIUM CHLORIDE 0.9 % IJ SOLN: 3.0000 mL | INTRAMUSCULAR | Status: DC | PRN

## 2012-10-04 NOTE — H&P (Signed)
Marie Woodward is a 37 y.o.MWF female presenting at [redacted]w[redacted]d w/ CC of SROM at 1700.  Having irregular, mild ctxs today, but states ctxs more intense and regular on Sunday (3d ago).  Reports fluid has been blood-tinged.  GFM.  No UTI or PIH s/s.  Accompanied by her husband, and her doula.  Planning waterbirth.   Prenatal Course: Pt entered care around 13 weeks at [facility].  Her pregnancy was complicated by presumptive kidney stone around 24 weeks, but otherwise, unremarkable.  Despite AMA, pt declined aneuploidy screening.  Trx'd with PCN at [facility] at 14 weeks for pos GBS in urine and plan to trx in labor.  Some hip pain in 2nd trimester and had f/u w/ her chiropractor, and actually didn't worsen w/ pregnancy.  Anatomy scan was incomplete at [redacted]w[redacted]d w/ difficulty seeing cardiac views, but completed at [redacted]w[redacted]d and all WNL.  Seen in MAU around 24 weeks for hematuria and suspected stone; referred to urologist outpt and u/s showed no stones.  No further issues or w/u, and expectant management thereafter.  1hr gtt WNL=119.  Pt stated she felt much better in her 3rd trimester than her second.    OB Hx: G1=SVD 08/15/08 at [facility] at 39 weeks, F=7+4 (IVF pregnancy) G2=SVD (waterbirth) 10/15/10 at 38 weeks, M=9+2 (IVF pregnancy) G3=current (spontaneous conception) . Maternal Medical History:  Reason for admission: Rupture of membranes.   Contractions: Onset was 2 days ago.   Frequency: irregular.   Perceived severity is mild.    Fetal activity: Perceived fetal activity is normal.   Last perceived fetal movement was within the past hour.      OB History   Grav Para Term Preterm Abortions TAB SAB Ect Mult Living   3 2 2       2      Past Medical History  Diagnosis Date  . Heart murmur 1999    HAD CARDIAC EVAL  . Infection     UTI X1  . Infection     OCC YEAST  . Headache(784.0)     RARE MIGRAINES   Past Surgical History  Procedure Laterality Date  . Wisdom tooth extraction     Family History: family history  includes Cancer in her paternal grandmother; Depression in her father; Diabetes in her father and paternal grandfather; Early death (age of onset: 66) in her mother; Heart disease in her maternal grandfather; Heart disease (age of onset: 65) in her mother; Hyperlipidemia in her brother; Hypertension in her mother; and Kidney disease in her father. Social History:  reports that she has never smoked. She has never used smokeless tobacco. She reports that she does not drink alcohol or use illicit drugs.   Prenatal Transfer Tool  Maternal Diabetes: No Genetic Screening: Declined Maternal Ultrasounds/Referrals: Normal Fetal Ultrasounds or other Referrals:  None Maternal Substance Abuse:  No Significant Maternal Medications:  None Significant Maternal Lab Results:  Lab values include: Group B Strep positive Other Comments:  Varicella non-immune  Review of Systems  Constitutional: Negative.   HENT: Negative.   Eyes: Negative.   Cardiovascular: Negative.   Gastrointestinal: Positive for heartburn.  Genitourinary: Negative.   Skin: Negative.   Neurological: Negative.     Dilation: 3 Effacement (%): 70 Station: -1 Exam by:: H. Morris Markham, CNM Blood pressure 125/71, pulse 107, temperature 98 F (36.7 C), temperature source Oral, resp. rate 18, height 5\' 5"  (1.651 m), weight 179 lb (81.194 kg), last menstrual period 01/07/2012, SpO2 100.00%. Maternal Exam:  Uterine Assessment: Contraction strength  is mild.  Contraction frequency is irregular.   Abdomen: Patient reports no abdominal tenderness. Fetal presentation: vertex  Introitus: Normal vulva. Ferning test: not done.  Nitrazine test: not done. Amniotic fluid character: bloody.  Pelvis: adequate for delivery.   Cervix: Cervix evaluated by digital exam.     Fetal Exam Fetal Monitor Review: Mode: ultrasound.   Baseline rate: 130.  Variability: moderate (6-25 bpm).   Pattern: accelerations present and no decelerations.    Fetal  State Assessment: Category I - tracings are normal.     Physical Exam  Constitutional: She is oriented to person, place, and time. She appears well-developed and well-nourished. No distress.  HENT:  Head: Normocephalic and atraumatic.  Eyes: Pupils are equal, round, and reactive to light.  Cardiovascular: Normal rate.   Respiratory: Effort normal.  GI: Soft.  gravid  Musculoskeletal: She exhibits no edema.  Neurological: She is alert and oriented to person, place, and time. She has normal reflexes.  Skin: Skin is warm and dry.  Psychiatric: She has a normal mood and affect. Her behavior is normal. Judgment and thought content normal.    Prenatal labs: ABO, Rh: O/POS/-- (12/03 0916) Antibody: NEG (12/03 0916) Rubella: 1.64 (12/03 0916) RPR: NON REAC (03/19 1649)  HBsAg: NEGATIVE (12/03 0916)  HIV: NON REACTIVE (12/03 0916)  GBS: Positive (12/06 1000)   Assessment/Plan: 1. [redacted]w[redacted]d 2. PROM 3. GBS pos 4. Desires waterbirth and low-intervention 5. Cat I FHT  1. Admit to [facility] w/ Dr. Normand Sloop as attending 2. Routine L&D orders 3. PCN-G per GBS protocol 4. Intermittent monitoring  5. Given options of augmentation vs expectant management; r/b/a rev'd of each.  In light of pt desiring waterbirth, she desires expectant management at present 6. Will defer immersion in tub until more intense ctxs 7. Light laboring diet 8. C/w MD prn 9. Anticipate SVD  [name] 10/04/2012, 8:45 PM

## 2012-10-04 NOTE — MAU Note (Signed)
Patient states she started leaking clear fluid(started out with a little pink) with contractions every 5 minutes. Reports good fetal movement. Pads are saturated with fluid. Reports good fetal movement.

## 2012-10-04 NOTE — MAU Note (Signed)
Patient has on a pad that is saturated with clear fluid. Spoke with Birdena Crandall, CN in BS and patient will be evaluated in room 174 for ROM.

## 2012-10-04 NOTE — Progress Notes (Signed)
Subjective: Pt resting in bed.  Doula, husband, and mother-in-law at bs.  Pt reports ctxs still irregular, but when present, are intense.  Tub is inflated, but not filled.  Pt has received one dose of PCN thus far and is due NST at 2330.    Objective: BP 114/61  Pulse 83  Temp(Src) 98.2 F (36.8 C) (Oral)  Resp 18  Ht 5\' 5"  (1.651 m)  Wt 179 lb (81.194 kg)  BMI 29.79 kg/m2  SpO2 100%  LMP 01/07/2012      FHT:  FHR: 130 bpm, variability: moderate,  accelerations:  Present,  decelerations:  Absent UC:   irregular SVE:   Dilation: 3 Effacement (%): 70 Station: -1 Exam by:: H. Nobel Brar, CNM Deferred; fluid is pink-tinged Labs: Lab Results  Component Value Date   WBC 10.1 10/04/2012   HGB 11.5* 10/04/2012   HCT 35.7* 10/04/2012   MCV 94.7 10/04/2012   PLT 232 10/04/2012    Assessment / Plan: 1. [redacted]w[redacted]d 2. PROM 3. GBS pos  Labor: latent Preeclampsia:  no signs or symptoms of toxicity Fetal Wellbeing:  Category I Pain Control:  Labor support without medications I/D:  n/a Anticipated MOD:  desires waterbirth and low-intervention 1. Continue current poc/expectant management 2. Support as needed 3. C/w MD prn  Dalila Arca H 10/04/2012, 11:21 PM

## 2012-10-04 NOTE — Progress Notes (Signed)
efm removed per orders for q 4 hr monitoring until in labor.

## 2012-10-05 ENCOUNTER — Encounter (HOSPITAL_COMMUNITY): Payer: Self-pay | Admitting: *Deleted

## 2012-10-05 LAB — RPR: RPR Ser Ql: NONREACTIVE

## 2012-10-05 MED ORDER — LANOLIN HYDROUS EX OINT: TOPICAL_OINTMENT | CUTANEOUS | Status: DC | PRN

## 2012-10-05 MED ORDER — SIMETHICONE 80 MG PO CHEW: 80.0000 mg | CHEWABLE_TABLET | ORAL | Status: DC | PRN

## 2012-10-05 MED ORDER — ONDANSETRON HCL 4 MG PO TABS: 4.0000 mg | ORAL_TABLET | ORAL | Status: DC | PRN

## 2012-10-05 MED ORDER — WITCH HAZEL-GLYCERIN EX PADS: 1.0000 "application " | MEDICATED_PAD | CUTANEOUS | Status: DC | PRN

## 2012-10-05 MED ORDER — OXYCODONE-ACETAMINOPHEN 5-325 MG PO TABS: 1.0000 | ORAL_TABLET | ORAL | Status: DC | PRN

## 2012-10-05 MED ORDER — OXYTOCIN 10 UNIT/ML IJ SOLN
INTRAMUSCULAR | Status: AC
  Filled 2012-10-05: qty 1

## 2012-10-05 MED ORDER — DIPHENHYDRAMINE HCL 25 MG PO CAPS: 25.0000 mg | ORAL_CAPSULE | Freq: Four times a day (QID) | ORAL | Status: DC | PRN

## 2012-10-05 MED ORDER — IBUPROFEN 600 MG PO TABS
600.0000 mg | ORAL_TABLET | Freq: Four times a day (QID) | ORAL | Status: DC
  Administered 2012-10-05 – 2012-10-06 (×2): 600 mg via ORAL
  Filled 2012-10-05 (×3): qty 1

## 2012-10-05 MED ORDER — DIBUCAINE 1 % RE OINT: 1.0000 "application " | TOPICAL_OINTMENT | RECTAL | Status: DC | PRN

## 2012-10-05 MED ORDER — TETANUS-DIPHTH-ACELL PERTUSSIS 5-2.5-18.5 LF-MCG/0.5 IM SUSP
0.5000 mL | Freq: Once | INTRAMUSCULAR | Status: DC
  Filled 2012-10-05: qty 0.5

## 2012-10-05 MED ORDER — BENZOCAINE-MENTHOL 20-0.5 % EX AERO
1.0000 "application " | INHALATION_SPRAY | CUTANEOUS | Status: DC | PRN
  Administered 2012-10-05: 1 via TOPICAL
  Filled 2012-10-05: qty 56

## 2012-10-05 MED ORDER — PRENATAL MULTIVITAMIN CH: 1.0000 | ORAL_TABLET | Freq: Every day | ORAL | Status: DC

## 2012-10-05 MED ORDER — ONDANSETRON HCL 4 MG/2ML IJ SOLN: 4.0000 mg | INTRAMUSCULAR | Status: DC | PRN

## 2012-10-05 MED ORDER — ZOLPIDEM TARTRATE 5 MG PO TABS: 5.0000 mg | ORAL_TABLET | Freq: Every evening | ORAL | Status: DC | PRN

## 2012-10-05 MED ORDER — SENNOSIDES-DOCUSATE SODIUM 8.6-50 MG PO TABS: 2.0000 | ORAL_TABLET | Freq: Every day | ORAL | Status: DC

## 2012-10-05 NOTE — Progress Notes (Signed)
  Subjective: Pt reports UCs are stronger and closer together, particularly when walking.  She is now getting tired and would like to try getting in the tub for rest.  Discussed that water may slow early labor down, pt agreed to monitor UCs and get out of the tub if it slows things down.  Again discussed how long she has been ruptured and asked how long she is okay with ROM without medicated IOL knowing that as time passes she is increasing her risk of infection.  Pt prefers to not do anything for IOL unless she shows signs of infection.   Objective: BP 95/56  Pulse 78  Temp(Src) 98.3 F (36.8 C) (Oral)  Resp 18  Ht 5\' 5"  (1.651 m)  Wt 179 lb (81.194 kg)  BMI 29.79 kg/m2  SpO2 100%  LMP 01/07/2012      FHT:  Reactive NST UC:   Irregular  SVE deferred at this time  Assessment / Plan:  Labor: Early labor Preeclampsia: no s/s Fetal Wellbeing: Reactive NST Pain Control: Relaxation, breathing, tub I/D: GBS prophylaxis Anticipated MOD: SVD - WB   Marie Woodward 10/05/2012, 1:27 PM

## 2012-10-05 NOTE — Progress Notes (Signed)
  Subjective: Pt sitting up comfortable.  Pt reports UCs have spaced out to about 20 min. Discussed R/B of waiting for labor to begin with prolong ROM.  Discussed options for inducing labor, including nipple stimulation, Cytotec, and Pitocin.  Offered time to allow family to discuss the options and make a decision.    Objective: BP 95/56  Pulse 78  Temp(Src) 98.3 F (36.8 C) (Oral)  Resp 18  Ht 5\' 5"  (1.651 m)  Wt 179 lb (81.194 kg)  BMI 29.79 kg/m2  SpO2 100%  LMP 01/07/2012     FHT:  Last NST reactive UC:   Irregular  SVE:   Dilation: 1.5 Effacement (%): 70 Station: -3 Exam by:: Jeidy Hoerner cnm  Assessment / Plan: PROM x14 hour  Labor: Irregular UCs, not very intense per pt Preeclampsia: no s/s Fetal Wellbeing: Reactive NST Pain Control:  n/a I/D: GBS prophylax per protocol Anticipated MOD: SVD - WB   Marie Woodward 10/05/2012, 8:46 AM

## 2012-10-05 NOTE — Progress Notes (Signed)
Subjective: Pt sleeping on Rt side.  Everyone else at bs asleep, except her husband, who is in rocking chair on laptop.  Pt had NST and 3rd dose of PCN around 0400.    Objective: BP 95/56  Pulse 78  Temp(Src) 98.3 F (36.8 C) (Oral)  Resp 20  Ht 5\' 5"  (1.651 m)  Wt 179 lb (81.194 kg)  BMI 29.79 kg/m2  SpO2 100%  LMP 01/07/2012      FHT:  FHR: 125 bpm, variability: moderate,  accelerations:  Present,  decelerations:  Present 2 variables, mild UC:   irregular, every 15 minutes some UI in between SVE:   Dilation: 3 Effacement (%): 70 Station: -1 Exam by:: Eustace Pen, CNM deferred Labs: Lab Results  Component Value Date   WBC 10.1 10/04/2012   HGB 11.5* 10/04/2012   HCT 35.7* 10/04/2012   MCV 94.7 10/04/2012   PLT 232 10/04/2012    Assessment / Plan: 1. [redacted]w[redacted]d 2. PROM 3. GBS pos  Labor: latent Preeclampsia:  no signs or symptoms of toxicity Fetal Wellbeing:  Category I Pain Control:  Labor support without medications I/D:  n/a Anticipated MOD:  NSVD 1. Did not waken pt; will continue w/ expectant management.  Roy Snuffer H 10/05/2012, 5:03 AM

## 2012-10-05 NOTE — Progress Notes (Signed)
  Subjective: Pt sitting up in bed.  UCs still mild.  Pt decided to try nipple stimulation, possibly in the shower, and try to take a walk.  Objective: BP 95/56  Pulse 78  Temp(Src) 98.3 F (36.8 C) (Oral)  Resp 18  Ht 5\' 5"  (1.651 m)  Wt 179 lb (81.194 kg)  BMI 29.79 kg/m2  SpO2 100%  LMP 01/07/2012      FHT:  Last NST was reactive UC:   Irregular   Assessment / Plan: PROM x16 hours  Labor: Irregular UCs Preeclampsia: no s/s Fetal Wellbeing: Reactive NST Pain Control: n/a I/D: GBS prophylaxis per protocol Anticipated MOD: SVD - WB   Gibson Lad 10/05/2012, 8:57 AM

## 2012-10-05 NOTE — Progress Notes (Signed)
Subjective: Pt sitting up in [place]'s position, drinking Gatorade.  Doula awake at bs and pt's husband and mother-in-law sleeping.  Pt states ctxs intense when present, but only about every 10 min.  She will  Have next NST at 0330, and next dose of PCN.  Pt thinks she may get in tub after that.  Pt hasn't slept much in anticipation of next ctx.  Objective: BP 114/61  Pulse 83  Temp(Src) 98.2 F (36.8 C) (Oral)  Resp 20  Ht 5\' 5"  (1.651 m)  Wt 179 lb (81.194 kg)  BMI 29.79 kg/m2  SpO2 100%  LMP 01/07/2012      FHT:  FHR: 130 bpm, variability: moderate,  accelerations:  Present,  decelerations:  Absent UC:   occ'l per pt SVE:   Dilation: 3 Effacement (%): 70 Station: -1 Exam by:: Eustace Pen, CNM Deferred presently Labs: Lab Results  Component Value Date   WBC 10.1 10/04/2012   HGB 11.5* 10/04/2012   HCT 35.7* 10/04/2012   MCV 94.7 10/04/2012   PLT 232 10/04/2012    Assessment / Plan: 1. [redacted]w[redacted]d 2. PROM 3. GBS pos  Labor: latent Preeclampsia:  no signs or symptoms of toxicity Fetal Wellbeing:  Category I Pain Control:  Labor support without medications I/D:  n/a Anticipated MOD:  NSVD 1. Encouraged rest; will check pt prior to getting into tub. 2. Support as needed.   3. C/w MD prn 4. Pt desires to continue w/ expectant management at present.  Avalene Sealy H 10/05/2012, 3:07 AM

## 2012-10-06 ENCOUNTER — Encounter (HOSPITAL_COMMUNITY): Payer: Self-pay

## 2012-10-06 DIAGNOSIS — O3660X0 Maternal care for excessive fetal growth, unspecified trimester, not applicable or unspecified: Secondary | ICD-10-CM

## 2012-10-06 HISTORY — DX: Maternal care for excessive fetal growth, unspecified trimester, not applicable or unspecified: O36.60X0

## 2012-10-06 LAB — CBC
Hemoglobin: 11.2 g/dL — ABNORMAL LOW (ref 12.0–15.0)
MCH: 30.9 pg (ref 26.0–34.0)
MCHC: 32.4 g/dL (ref 30.0–36.0)
MCV: 95.3 fL (ref 78.0–100.0)

## 2012-10-06 NOTE — Discharge Summary (Signed)
  Vaginal Delivery Discharge Summary  Chosen Garron  DOB:    1975/08/28 MRN:    [id number] CSN:    [id number]  Date of admission:                  10/04/12  Date of discharge:                   10/06/12  Procedures this admission:  Date of Delivery: 10/05/12  Newborn Data:  Live born female  Birth Weight: 9 lb 3.6 oz (4184 g) APGAR: 9, 9  Home with mother. Name: Pax Circumcision Plan: Inpatient  History of Present Illness:  Marie Woodward is a 37 y.o. female, G3P3003, who presents at [redacted]w[redacted]d weeks gestation. The patient has been followed at the [facility]cology division of [facility]. She was admitted rupture of membranes prior to the onset of labor. Her pregnancy has been complicated by:  Patient Active Problem List   Diagnosis Date Noted  . NSVD (normal spontaneous vaginal delivery) 10/05/2012  . PROM (premature rupture of membranes) 10/04/2012  . Kidney stone 06/27/2012  . Need for varicella vaccine 04/20/2012  . GBS (group B streptococcus) UTI complicating pregnancy 04/16/2012  . Annual physical exam 02/28/2012  . Normal pregnancy, incidental 02/28/2012  . AMA (advanced maternal age) multigravida 35+ 02/28/2012     Hospital course:  The patient was admitted for PROM at term on 10/04/12 at 5pm, with no significant labor noted.  She was begun on GBS prophylaxis.  She declined augmentation and was planning a repeat waterbirth.  Labor did not ensue until approx 3pm on 5/29, and then it progressed rapidly, with delivery at 4:07pm in the waterbirth tub, attended by the RN, Dr. Stefano Gaul, and Haroldine Laws, CNM.  Labor was complicated only by prolonged ROM and GBS + status.  Her delivery was not complicated. Her postpartum course was not complicated.  She was discharged to home on postpartum day 1 doing well.  Feeding:  breast  Contraception:  Will decide at 6 weeks.  Discharge hemoglobin:  Hemoglobin  Date Value Range  Status  10/06/2012 11.2* 12.0 - 15.0 g/dL Final     HCT  Date Value Range Status  10/06/2012 34.6* 36.0 - 46.0 % Final  Patient declined pre-delivery labs  Discharge Physical Exam:   General: alert Lochia: appropriate Uterine Fundus: firm Incision: healing well DVT Evaluation: No evidence of DVT seen on physical exam. Negative Homan's sign.  Intrapartum Procedures: spontaneous vaginal delivery in waterbirth tub Postpartum Procedures: none Complications-Operative and Postpartum: Prolonged ROM without evidence of infection  Discharge Diagnoses: Term Pregnancy-delivered and PROM x23 hours, GBS+  Discharge Information:  Activity:           Per [facility] handout Diet:                routine Medications: None--declines pain meds Condition:      stable Instructions:  refer to practice specific booklet Discharge to: home  Follow-up Information   Follow up with [facility]. Schedule an appointment as soon as possible for a visit in 6 weeks. (Call for any questions or concerns)    Contact information:   [address] [place] [address] [phone]       Nigel Bridgeman 10/06/2012

## 2013-10-03 ENCOUNTER — Encounter (HOSPITAL_COMMUNITY): Payer: Self-pay | Admitting: *Deleted

## 2013-10-03 ENCOUNTER — Other Ambulatory Visit: Payer: Self-pay | Admitting: Obstetrics and Gynecology

## 2013-10-04 ENCOUNTER — Encounter (HOSPITAL_COMMUNITY): Payer: PRIVATE HEALTH INSURANCE | Admitting: Anesthesiology

## 2013-10-04 ENCOUNTER — Ambulatory Visit (HOSPITAL_COMMUNITY)
Admission: RE | Admit: 2013-10-04 | Discharge: 2013-10-04 | Disposition: A | Discharge status: Home / Self Care | Payer: PRIVATE HEALTH INSURANCE | Source: Ambulatory Visit | Attending: Obstetrics and Gynecology | Admitting: Obstetrics and Gynecology

## 2013-10-04 ENCOUNTER — Ambulatory Visit (HOSPITAL_COMMUNITY): Payer: PRIVATE HEALTH INSURANCE | Admitting: Anesthesiology

## 2013-10-04 ENCOUNTER — Encounter (HOSPITAL_COMMUNITY)
Admission: RE | Disposition: A | Discharge status: Home / Self Care | Payer: Self-pay | Source: Ambulatory Visit | Attending: Obstetrics and Gynecology

## 2013-10-04 DIAGNOSIS — O021 Missed abortion: Secondary | ICD-10-CM | POA: Diagnosis present

## 2013-10-04 DIAGNOSIS — N289 Disorder of kidney and ureter, unspecified: Secondary | ICD-10-CM | POA: Diagnosis not present

## 2013-10-04 HISTORY — PX: DILATION AND EVACUATION: SHX1459

## 2013-10-04 LAB — CBC
HEMATOCRIT: 39.3 % (ref 36.0–46.0)
HEMOGLOBIN: 13 g/dL (ref 12.0–15.0)
MCH: 31.4 pg (ref 26.0–34.0)
MCHC: 33.1 g/dL (ref 30.0–36.0)
MCV: 94.9 fL (ref 78.0–100.0)
Platelets: 234 10*3/uL (ref 150–400)
RBC: 4.14 MIL/uL (ref 3.87–5.11)
RDW: 13.1 % (ref 11.5–15.5)
WBC: 6.8 10*3/uL (ref 4.0–10.5)

## 2013-10-04 SURGERY — DILATION AND EVACUATION, UTERUS
Anesthesia: Monitor Anesthesia Care | Site: Vagina

## 2013-10-04 MED ORDER — FENTANYL CITRATE 0.05 MG/ML IJ SOLN
INTRAMUSCULAR | Status: DC | PRN
  Administered 2013-10-04: 50 ug via INTRAVENOUS

## 2013-10-04 MED ORDER — ONDANSETRON HCL 4 MG/2ML IJ SOLN
INTRAMUSCULAR | Status: AC
  Filled 2013-10-04: qty 2

## 2013-10-04 MED ORDER — IBUPROFEN 600 MG PO TABS: 600.0000 mg | ORAL_TABLET | Freq: Four times a day (QID) | ORAL | Status: DC | PRN

## 2013-10-04 MED ORDER — PROPOFOL 10 MG/ML IV EMUL
INTRAVENOUS | Status: AC
  Filled 2013-10-04: qty 40

## 2013-10-04 MED ORDER — KETOROLAC TROMETHAMINE 30 MG/ML IJ SOLN
INTRAMUSCULAR | Status: AC
  Filled 2013-10-04: qty 1

## 2013-10-04 MED ORDER — KETOROLAC TROMETHAMINE 30 MG/ML IJ SOLN
INTRAMUSCULAR | Status: DC | PRN
  Administered 2013-10-04: 30 mg via INTRAVENOUS

## 2013-10-04 MED ORDER — PROPOFOL 10 MG/ML IV EMUL
INTRAVENOUS | Status: DC | PRN
  Administered 2013-10-04: 50 mg via INTRAVENOUS

## 2013-10-04 MED ORDER — 0.9 % SODIUM CHLORIDE (POUR BTL) OPTIME
TOPICAL | Status: DC | PRN
  Administered 2013-10-04: 1000 mL

## 2013-10-04 MED ORDER — MIDAZOLAM HCL 2 MG/2ML IJ SOLN
INTRAMUSCULAR | Status: DC | PRN
  Administered 2013-10-04: 2 mg via INTRAVENOUS

## 2013-10-04 MED ORDER — KETOROLAC TROMETHAMINE 30 MG/ML IJ SOLN: 15.0000 mg | Freq: Once | INTRAMUSCULAR | Status: DC | PRN

## 2013-10-04 MED ORDER — LIDOCAINE HCL (CARDIAC) 20 MG/ML IV SOLN
INTRAVENOUS | Status: DC | PRN
  Administered 2013-10-04: 40 mg via INTRAVENOUS

## 2013-10-04 MED ORDER — FENTANYL CITRATE 0.05 MG/ML IJ SOLN: 25.0000 ug | INTRAMUSCULAR | Status: DC | PRN

## 2013-10-04 MED ORDER — DEXAMETHASONE SODIUM PHOSPHATE 10 MG/ML IJ SOLN
INTRAMUSCULAR | Status: DC | PRN
Start: 2013-10-04 — End: 2013-10-04
  Administered 2013-10-04: 10 mg via INTRAVENOUS

## 2013-10-04 MED ORDER — ONDANSETRON HCL 4 MG/2ML IJ SOLN
INTRAMUSCULAR | Status: DC | PRN
  Administered 2013-10-04: 4 mg via INTRAVENOUS

## 2013-10-04 MED ORDER — LIDOCAINE HCL (CARDIAC) 20 MG/ML IV SOLN
INTRAVENOUS | Status: AC
Start: 2013-10-04 — End: 2013-10-04
  Filled 2013-10-04: qty 5

## 2013-10-04 MED ORDER — LACTATED RINGERS IV SOLN
INTRAVENOUS | Status: DC
  Administered 2013-10-04: 13:00:00 via INTRAVENOUS

## 2013-10-04 MED ORDER — MEPERIDINE HCL 25 MG/ML IJ SOLN: 6.2500 mg | INTRAMUSCULAR | Status: DC | PRN

## 2013-10-04 MED ORDER — LIDOCAINE HCL 2 % IJ SOLN
INTRAMUSCULAR | Status: AC
  Filled 2013-10-04: qty 20

## 2013-10-04 MED ORDER — HYDROCODONE-ACETAMINOPHEN 5-325 MG PO TABS: 1.0000 | ORAL_TABLET | Freq: Four times a day (QID) | ORAL | Status: DC | PRN

## 2013-10-04 MED ORDER — MIDAZOLAM HCL 2 MG/2ML IJ SOLN: 0.5000 mg | Freq: Once | INTRAMUSCULAR | Status: DC | PRN

## 2013-10-04 MED ORDER — MIDAZOLAM HCL 2 MG/2ML IJ SOLN
INTRAMUSCULAR | Status: AC
  Filled 2013-10-04: qty 2

## 2013-10-04 MED ORDER — LIDOCAINE HCL 2 % IJ SOLN
INTRAMUSCULAR | Status: DC | PRN
  Administered 2013-10-04: 10 mL

## 2013-10-04 MED ORDER — PROMETHAZINE HCL 25 MG/ML IJ SOLN: 6.2500 mg | INTRAMUSCULAR | Status: DC | PRN

## 2013-10-04 MED ORDER — FENTANYL CITRATE 0.05 MG/ML IJ SOLN
INTRAMUSCULAR | Status: AC
  Filled 2013-10-04: qty 2

## 2013-10-04 MED ORDER — PROPOFOL INFUSION 10 MG/ML OPTIME
INTRAVENOUS | Status: DC | PRN
Start: 2013-10-04 — End: 2013-10-04
  Administered 2013-10-04: 120 ug/kg/min via INTRAVENOUS

## 2013-10-04 MED ORDER — DEXAMETHASONE SODIUM PHOSPHATE 10 MG/ML IJ SOLN
INTRAMUSCULAR | Status: AC
  Filled 2013-10-04: qty 1

## 2013-10-04 SURGICAL SUPPLY — 20 items
CATH ROBINSON RED A/P 16FR (CATHETERS) ×3 IMPLANT
CLOTH BEACON ORANGE TIMEOUT ST (SAFETY) ×3 IMPLANT
DECANTER SPIKE VIAL GLASS SM (MISCELLANEOUS) ×3 IMPLANT
GLOVE BIO SURGEON STRL SZ7.5 (GLOVE) ×3 IMPLANT
GLOVE BIOGEL PI IND STRL 7.5 (GLOVE) ×2 IMPLANT
GLOVE BIOGEL PI INDICATOR 7.5 (GLOVE) ×4
GOWN STRL REUS W/TWL LRG LVL3 (GOWN DISPOSABLE) ×6 IMPLANT
KIT BERKELEY 1ST TRIMESTER 3/8 (MISCELLANEOUS) ×3 IMPLANT
NEEDLE SPNL 22GX3.5 QUINCKE BK (NEEDLE) ×3 IMPLANT
NS IRRIG 1000ML POUR BTL (IV SOLUTION) ×3 IMPLANT
PACK VAGINAL MINOR WOMEN LF (CUSTOM PROCEDURE TRAY) ×3 IMPLANT
PAD OB MATERNITY 4.3X12.25 (PERSONAL CARE ITEMS) ×3 IMPLANT
PAD PREP 24X48 CUFFED NSTRL (MISCELLANEOUS) ×3 IMPLANT
SET BERKELEY SUCTION TUBING (SUCTIONS) ×3 IMPLANT
SYR CONTROL 10ML LL (SYRINGE) ×3 IMPLANT
TOWEL OR 17X24 6PK STRL BLUE (TOWEL DISPOSABLE) ×6 IMPLANT
VACURETTE 10 RIGID CVD (CANNULA) IMPLANT
VACURETTE 7MM CVD STRL WRAP (CANNULA) IMPLANT
VACURETTE 8 RIGID CVD (CANNULA) IMPLANT
VACURETTE 9 RIGID CVD (CANNULA) ×3 IMPLANT

## 2013-10-04 NOTE — Transfer of Care (Signed)
Immediate Anesthesia Transfer of Care Note  Patient: Marie Woodward  Procedure(s) Performed: Procedure(s): DILATATION AND EVACUATION (N/A)  Patient Location: PACU  Anesthesia Type:MAC  Level of Consciousness: sedated  Airway & Oxygen Therapy: Patient Spontanous Breathing and Patient connected to nasal cannula oxygen  Post-op Assessment: Report given to PACU RN and Post -op Vital signs reviewed and stable  Post vital signs: Reviewed and stable  Complications: No apparent anesthesia complications

## 2013-10-04 NOTE — OR Nursing (Signed)
Reviewed charting for RN orientation.  Added information in the procedural charting.  Safety strap was on preop and postop.

## 2013-10-04 NOTE — Anesthesia Preprocedure Evaluation (Signed)
Anesthesia Evaluation  Patient identified by MRN, date of birth, ID band Patient awake    Reviewed: Allergy & Precautions, H&P , Patient's Chart, lab work & pertinent test results, reviewed documented beta blocker date and time   History of Anesthesia Complications Negative for: history of anesthetic complications  Airway Mallampati: II TM Distance: >3 FB Neck ROM: full    Dental   Pulmonary  breath sounds clear to auscultation        Cardiovascular Exercise Tolerance: Good + Valvular Problems/Murmurs Rhythm:regular Rate:Normal     Neuro/Psych negative psych ROS   GI/Hepatic   Endo/Other    Renal/GU Renal disease     Musculoskeletal   Abdominal   Peds  Hematology   Anesthesia Other Findings   Reproductive/Obstetrics                           Anesthesia Physical Anesthesia Plan  ASA: II  Anesthesia Plan: MAC   Post-op Pain Management:    Induction:   Airway Management Planned:   Additional Equipment:   Intra-op Plan:   Post-operative Plan:   Informed Consent: I have reviewed the patients History and Physical, chart, labs and discussed the procedure including the risks, benefits and alternatives for the proposed anesthesia with the patient or authorized representative who has indicated his/her understanding and acceptance.   Dental Advisory Given  Plan Discussed with: CRNA, Surgeon and Anesthesiologist  Anesthesia Plan Comments:         Anesthesia Quick Evaluation

## 2013-10-04 NOTE — Anesthesia Postprocedure Evaluation (Signed)
Anesthesia Post Note  Patient: Marie Woodward  Procedure(s) Performed: Procedure(s) (LRB): DILATATION AND EVACUATION (N/A)  Anesthesia type: MAC  Patient location: PACU  Post pain: Pain level controlled  Post assessment: Post-op Vital signs reviewed  Last Vitals:  Filed Vitals:   10/04/13 1540  BP: 107/68  Pulse: 53  Temp: 36.7 C  Resp: 16    Post vital signs: Reviewed  Level of consciousness: sedated  Complications: No apparent anesthesia complications

## 2013-10-04 NOTE — Discharge Instructions (Signed)

## 2013-10-04 NOTE — Anesthesia Procedure Notes (Signed)
Procedure Name: MAC Date/Time: 10/04/2013 1:31 PM Performed by: Troyce Gieske, Jannet Askew Pre-anesthesia Checklist: Patient identified, Emergency Drugs available, Suction available, Patient being monitored and Timeout performed Patient Re-evaluated:Patient Re-evaluated prior to inductionOxygen Delivery Method: Nasal cannula

## 2013-10-04 NOTE — Op Note (Signed)
Preop Diagnosis: Missed abortion   Postop Diagnosis: Missed abortion   Procedure: SUCTION D&C/DILATATION AND EVACUATION   Anesthesia: Monitor Anesthesia Care   Anesthesiologist: Leilani Able, MD   Attending: Purcell Nails, MD   Assistant: N/A  Findings: MOD POCS  Pathology: POCS  Fluids: 700 cc  UOP: QS via straight cath prior to procedure  EBL: Minimal  Complications: None  Procedure: The patient was taken to the operating room after the risks benefits and alternatives were discussed with the patient, the patient verbalized understanding and consent signed and witnessed.  The patient was placed under MAC anesthesia, prepped and draped in the normal sterile fashion and a time out was performed.  A bivalve speculum was placed in the patient's vagina and the anterior lip of the cervix grasped with a single-tooth tenaculum. A paracervical block was administered using a total of 10 cc of 2% lidocaine.  The uterus was sounded to 10.5 cm and a size 9 suction curette was used. Suction curettage was performed until minimal tissue returned. Sharp curettage was performed until a gritty texture was noted. Suction curettage was performed once again to remove any remaining debris. All instruments were removed. The count was correct. The patient was transferred to the recovery room in good condition.

## 2013-10-04 NOTE — H&P (Addendum)
Marie Woodward is an 38 y.o. female.  Dx'd with missed AB in office on u/s on Tues.  Pt has a certain LMP of 07/16/13.  Pt reports persistent spotting and cramping the last 24hrs.     Past Medical History  Diagnosis Date  . Heart murmur 1999    HAD CARDIAC EVAL  . Infection     UTI X1  . Infection     OCC YEAST  . Headache(784.0)     RARE MIGRAINES  . Fetal macrosomia, delivered, current hospitalization 10/06/2012    Past Surgical History  Procedure Laterality Date  . Wisdom tooth extraction      Family History  Problem Relation Age of Onset  . Heart disease Mother 79  . Early death Mother 46  . Hypertension Mother   . Diabetes Father     type 2   . Depression Father   . Kidney disease Father     STONES  . Heart disease Maternal Grandfather   . Cancer Paternal Grandmother   . Diabetes Paternal Grandfather     Type 2  . Hyperlipidemia Brother     Social History:  reports that she has never smoked. She has never used smokeless tobacco. She reports that she does not drink alcohol or use illicit drugs.  Allergies: No Known Allergies  Prescriptions prior to admission  Medication Sig Dispense Refill  . Prenatal Vit-Fe Fumarate-FA (PRENATAL MULTIVITAMIN) TABS Take 1 tablet by mouth daily.        ROS  non-contributory  Blood pressure 113/74, pulse 76, temperature 98.1 F (36.7 C), temperature source Oral, resp. rate 18, height 5\' 7"  (1.702 m), weight 74.844 kg (165 lb), SpO2 100.00%, unknown if currently breastfeeding. Physical Exam Lungs CTA CV RRR Abd soft, NT VE deferred  Results for orders placed during the hospital encounter of 10/04/13 (from the past 24 hour(s))  CBC     Status: None   Collection Time    10/04/13 12:10 PM      Result Value Ref Range   WBC 6.8  4.0 - 10.5 K/uL   RBC 4.14  3.87 - 5.11 MIL/uL   Hemoglobin 13.0  12.0 - 15.0 g/dL   HCT 98.2  64.1 - 58.3 %   MCV 94.9  78.0 - 100.0 fL   MCH 31.4  26.0 - 34.0 pg   MCHC 33.1  30.0 - 36.0 g/dL    RDW 09.4  07.6 - 80.8 %   Platelets 234  150 - 400 K/uL   Blood type from office chart O+  Assessment/Plan: P3 with missed AB dx'd on u/s on Tues of this week when pt presented with spotting.  Options were discussed with the patient and the pt opted to proceed with suction D&C/D&E.  R/B/A discussed with patient and consent signed and witnessed.  Purcell Nails 10/04/2013, 1:24 PM

## 2013-10-05 ENCOUNTER — Encounter (HOSPITAL_COMMUNITY): Payer: Self-pay | Admitting: Obstetrics and Gynecology

## 2014-01-25 ENCOUNTER — Other Ambulatory Visit: Payer: Self-pay | Admitting: Dermatology

## 2014-03-11 ENCOUNTER — Encounter (HOSPITAL_COMMUNITY): Payer: Self-pay | Admitting: Obstetrics and Gynecology

## 2014-03-29 ENCOUNTER — Other Ambulatory Visit: Payer: Self-pay | Admitting: Obstetrics and Gynecology

## 2014-03-29 ENCOUNTER — Encounter (HOSPITAL_COMMUNITY): Payer: Self-pay | Admitting: *Deleted

## 2014-04-02 ENCOUNTER — Ambulatory Visit (HOSPITAL_COMMUNITY): Payer: No Typology Code available for payment source | Admitting: Certified Registered Nurse Anesthetist

## 2014-04-02 ENCOUNTER — Encounter (HOSPITAL_COMMUNITY)
Admission: RE | Disposition: A | Discharge status: Home / Self Care | Payer: Self-pay | Source: Ambulatory Visit | Attending: Obstetrics and Gynecology

## 2014-04-02 ENCOUNTER — Ambulatory Visit (HOSPITAL_COMMUNITY): Payer: No Typology Code available for payment source

## 2014-04-02 ENCOUNTER — Ambulatory Visit (HOSPITAL_COMMUNITY)
Admission: RE | Admit: 2014-04-02 | Discharge: 2014-04-02 | Disposition: A | Discharge status: Home / Self Care | Payer: No Typology Code available for payment source | Source: Ambulatory Visit | Attending: Obstetrics and Gynecology | Admitting: Obstetrics and Gynecology

## 2014-04-02 ENCOUNTER — Encounter (HOSPITAL_COMMUNITY): Payer: Self-pay

## 2014-04-02 DIAGNOSIS — O039 Complete or unspecified spontaneous abortion without complication: Secondary | ICD-10-CM

## 2014-04-02 DIAGNOSIS — O021 Missed abortion: Secondary | ICD-10-CM | POA: Insufficient documentation

## 2014-04-02 HISTORY — DX: Calculus of kidney: N20.0

## 2014-04-02 HISTORY — PX: DILATION AND EVACUATION: SHX1459

## 2014-04-02 LAB — CBC
HCT: 36.1 % (ref 36.0–46.0)
Hemoglobin: 11.9 g/dL — ABNORMAL LOW (ref 12.0–15.0)
MCH: 31.4 pg (ref 26.0–34.0)
MCHC: 33 g/dL (ref 30.0–36.0)
MCV: 95.3 fL (ref 78.0–100.0)
PLATELETS: 208 10*3/uL (ref 150–400)
RBC: 3.79 MIL/uL — ABNORMAL LOW (ref 3.87–5.11)
RDW: 13 % (ref 11.5–15.5)
WBC: 5.3 10*3/uL (ref 4.0–10.5)

## 2014-04-02 SURGERY — DILATION AND EVACUATION, UTERUS
Anesthesia: Monitor Anesthesia Care | Site: Vagina

## 2014-04-02 MED ORDER — SCOPOLAMINE 1 MG/3DAYS TD PT72
MEDICATED_PATCH | TRANSDERMAL | Status: AC
  Filled 2014-04-02: qty 1

## 2014-04-02 MED ORDER — SCOPOLAMINE 1 MG/3DAYS TD PT72
1.0000 | MEDICATED_PATCH | Freq: Once | TRANSDERMAL | Status: DC
Start: 2014-04-02 — End: 2014-04-02
  Administered 2014-04-02: 1.5 mg via TRANSDERMAL

## 2014-04-02 MED ORDER — LIDOCAINE HCL (CARDIAC) 20 MG/ML IV SOLN
INTRAVENOUS | Status: DC | PRN
  Administered 2014-04-02: 60 mg via INTRAVENOUS

## 2014-04-02 MED ORDER — LIDOCAINE HCL (CARDIAC) 20 MG/ML IV SOLN
INTRAVENOUS | Status: AC
  Filled 2014-04-02: qty 5

## 2014-04-02 MED ORDER — LIDOCAINE HCL 1 % IJ SOLN
INTRAMUSCULAR | Status: AC
  Filled 2014-04-02: qty 20

## 2014-04-02 MED ORDER — LIDOCAINE HCL 2 % IJ SOLN
INTRAMUSCULAR | Status: DC | PRN
  Administered 2014-04-02: 20 mL

## 2014-04-02 MED ORDER — PROPOFOL 10 MG/ML IV EMUL
INTRAVENOUS | Status: AC
  Filled 2014-04-02: qty 20

## 2014-04-02 MED ORDER — MIDAZOLAM HCL 2 MG/2ML IJ SOLN
INTRAMUSCULAR | Status: DC | PRN
  Administered 2014-04-02: 2 mg via INTRAVENOUS

## 2014-04-02 MED ORDER — HYDROCODONE-ACETAMINOPHEN 5-325 MG PO TABS: 1.0000 | ORAL_TABLET | Freq: Four times a day (QID) | ORAL | Status: DC | PRN

## 2014-04-02 MED ORDER — FENTANYL CITRATE 0.05 MG/ML IJ SOLN
INTRAMUSCULAR | Status: AC
  Filled 2014-04-02: qty 2

## 2014-04-02 MED ORDER — MIDAZOLAM HCL 2 MG/2ML IJ SOLN
INTRAMUSCULAR | Status: AC
  Filled 2014-04-02: qty 2

## 2014-04-02 MED ORDER — LACTATED RINGERS IV SOLN
INTRAVENOUS | Status: DC
  Administered 2014-04-02 (×2): via INTRAVENOUS

## 2014-04-02 MED ORDER — GLYCOPYRROLATE 0.2 MG/ML IJ SOLN
INTRAMUSCULAR | Status: AC
  Filled 2014-04-02: qty 1

## 2014-04-02 MED ORDER — KETOROLAC TROMETHAMINE 30 MG/ML IJ SOLN
INTRAMUSCULAR | Status: AC
Start: 2014-04-02 — End: 2014-04-02
  Filled 2014-04-02: qty 1

## 2014-04-02 MED ORDER — KETOROLAC TROMETHAMINE 30 MG/ML IJ SOLN: 15.0000 mg | Freq: Once | INTRAMUSCULAR | Status: DC | PRN

## 2014-04-02 MED ORDER — PROPOFOL INFUSION 10 MG/ML OPTIME
INTRAVENOUS | Status: DC | PRN
  Administered 2014-04-02: 200 ug/kg/min via INTRAVENOUS

## 2014-04-02 MED ORDER — ONDANSETRON HCL 4 MG/2ML IJ SOLN
INTRAMUSCULAR | Status: AC
  Filled 2014-04-02: qty 2

## 2014-04-02 MED ORDER — ONDANSETRON HCL 4 MG/2ML IJ SOLN
INTRAMUSCULAR | Status: DC | PRN
  Administered 2014-04-02: 4 mg via INTRAVENOUS

## 2014-04-02 MED ORDER — MEPERIDINE HCL 25 MG/ML IJ SOLN: 6.2500 mg | INTRAMUSCULAR | Status: DC | PRN

## 2014-04-02 MED ORDER — KETOROLAC TROMETHAMINE 30 MG/ML IJ SOLN
INTRAMUSCULAR | Status: DC | PRN
  Administered 2014-04-02: 30 mg via INTRAVENOUS

## 2014-04-02 MED ORDER — SILVER NITRATE-POT NITRATE 75-25 % EX MISC
CUTANEOUS | Status: DC | PRN
  Administered 2014-04-02: 2

## 2014-04-02 MED ORDER — ONDANSETRON HCL 4 MG/2ML IJ SOLN: 4.0000 mg | Freq: Once | INTRAMUSCULAR | Status: DC | PRN

## 2014-04-02 MED ORDER — 0.9 % SODIUM CHLORIDE (POUR BTL) OPTIME
TOPICAL | Status: DC | PRN
  Administered 2014-04-02: 1000 mL

## 2014-04-02 MED ORDER — DOXYCYCLINE HYCLATE 50 MG PO CAPS: ORAL_CAPSULE | ORAL | Status: DC

## 2014-04-02 MED ORDER — FENTANYL CITRATE 0.05 MG/ML IJ SOLN
INTRAMUSCULAR | Status: DC | PRN
  Administered 2014-04-02: 100 ug via INTRAVENOUS

## 2014-04-02 MED ORDER — FENTANYL CITRATE 0.05 MG/ML IJ SOLN: 25.0000 ug | INTRAMUSCULAR | Status: DC | PRN

## 2014-04-02 MED ORDER — GLYCOPYRROLATE 0.2 MG/ML IJ SOLN
INTRAMUSCULAR | Status: DC | PRN
  Administered 2014-04-02: 0.2 mg via INTRAVENOUS

## 2014-04-02 MED ORDER — IBUPROFEN 600 MG PO TABS: 600.0000 mg | ORAL_TABLET | Freq: Four times a day (QID) | ORAL | Status: DC | PRN

## 2014-04-02 SURGICAL SUPPLY — 17 items
CATH ROBINSON RED A/P 16FR (CATHETERS) ×3 IMPLANT
CLOTH BEACON ORANGE TIMEOUT ST (SAFETY) ×3 IMPLANT
DECANTER SPIKE VIAL GLASS SM (MISCELLANEOUS) ×3 IMPLANT
GLOVE BIO SURGEON STRL SZ 6.5 (GLOVE) ×2 IMPLANT
GLOVE BIO SURGEONS STRL SZ 6.5 (GLOVE) ×1
GLOVE BIOGEL PI IND STRL 7.0 (GLOVE) ×2 IMPLANT
GLOVE BIOGEL PI INDICATOR 7.0 (GLOVE) ×4
GOWN STRL REUS W/TWL LRG LVL3 (GOWN DISPOSABLE) ×6 IMPLANT
KIT BERKELEY 1ST TRIMESTER 3/8 (MISCELLANEOUS) ×3 IMPLANT
NS IRRIG 1000ML POUR BTL (IV SOLUTION) ×3 IMPLANT
PACK VAGINAL MINOR WOMEN LF (CUSTOM PROCEDURE TRAY) ×3 IMPLANT
PAD OB MATERNITY 4.3X12.25 (PERSONAL CARE ITEMS) ×3 IMPLANT
PAD PREP 24X48 CUFFED NSTRL (MISCELLANEOUS) ×3 IMPLANT
SET BERKELEY SUCTION TUBING (SUCTIONS) ×3 IMPLANT
SYR 20CC LL (SYRINGE) ×3 IMPLANT
TOWEL OR 17X24 6PK STRL BLUE (TOWEL DISPOSABLE) ×6 IMPLANT
VACURETTE 10 RIGID CVD (CANNULA) ×3 IMPLANT

## 2014-04-02 NOTE — H&P (Signed)
Marie Woodward is an 38 y.o. female. Pt presents for D&E for missed abortion  Pertinent Gynecological History: Menses: flow is moderate Bleeding: none Contraception: none DES exposure: unknown Blood transfusions: none Sexually transmitted diseases: no past history Previous GYN Procedures: DNC  Last mammogram: na Date: na Last pap: normal Date: 2015 OB History: G4, P2022   Menstrual History: Menarche age: [age]  No LMP recorded.    Past Medical History  Diagnosis Date  . Heart murmur 1999    HAD CARDIAC EVAL  . Infection     UTI X1  . Infection     OCC YEAST  . Fetal macrosomia, delivered, current hospitalization 10/06/2012  . Kidney stones     passed stones - no surgery  . SVD (spontaneous vaginal delivery)     x 3    Past Surgical History  Procedure Laterality Date  . Wisdom tooth extraction    . Dilation and evacuation N/A 10/04/2013    Procedure: DILATATION AND EVACUATION;  Surgeon: Purcell NailsAngela Y Roberts, MD;  Location: [facility];  Service: Gynecology;  Laterality: N/A;    Family History  Problem Relation Age of Onset  . Heart disease Mother 5150  . Early death Mother 3650  . Hypertension Mother   . Diabetes Father     type 2   . Depression Father   . Kidney disease Father     STONES  . Heart disease Maternal Grandfather   . Cancer Paternal Grandmother   . Diabetes Paternal Grandfather     Type 2  . Hyperlipidemia Brother     Social History:  reports that she has never smoked. She has never used smokeless tobacco. She reports that she does not drink alcohol or use illicit drugs.  Allergies: No Known Allergies  Prescriptions prior to admission  Medication Sig Dispense Refill Last Dose  . acetaminophen (TYLENOL) 325 MG tablet Take 325 mg by mouth every 6 (six) hours as needed for headache.   Past Week at Unknown time  . Prenatal Vit-Fe Fumarate-FA (PRENATAL MULTIVITAMIN) TABS Take 1 tablet by mouth daily.   Past Week at Unknown time  . HYDROcodone-acetaminophen  (NORCO/VICODIN) 5-325 MG per tablet Take 1 tablet by mouth every 6 (six) hours as needed for moderate pain. (Patient not taking: Reported on 04/02/2014) 30 tablet 0   . ibuprofen (ADVIL,MOTRIN) 600 MG tablet Take 1 tablet (600 mg total) by mouth every 6 (six) hours as needed. (Patient not taking: Reported on 04/02/2014) 30 tablet 0     ROS  Blood pressure 102/62, pulse 53, temperature 97.9 F (36.6 C), temperature source Oral, resp. rate 16, height 5\' 7"  (1.702 m), weight 73.029 kg (161 lb), SpO2 100 %, unknown if currently breastfeeding. Physical Exam  Physical Examination: General appearance - alert, well appearing, and in no distress Heart - normal rate and regular rhythm Abdomen - soft, nontender, nondistended, no masses or organomegaly Breasts - breasts appear normal, no suspicious masses, no skin or nipple changes or axillary nodes, patient declines to have breast exam Pelvic - exam declined by the patient, def to OR Extremities - peripheral pulses normal, no pedal edema, no clubbing or cyanosis, no pedal edema noted  Results for orders placed or performed during the hospital encounter of 04/02/14 (from the past 24 hour(s))  CBC     Status: Abnormal   Collection Time: 04/02/14 10:25 AM  Result Value Ref Range   WBC 5.3 4.0 - 10.5 K/uL   RBC 3.79 (L) 3.87 - 5.11 MIL/uL  Hemoglobin 11.9 (L) 12.0 - 15.0 g/dL   HCT 46.936.1 62.936.0 - 52.846.0 %   MCV 95.3 78.0 - 100.0 fL   MCH 31.4 26.0 - 34.0 pg   MCHC 33.0 30.0 - 36.0 g/dL   RDW 41.313.0 24.411.5 - 01.015.5 %   Platelets 208 150 - 400 K/uL    No results found.  Assessment/Plan: Missed abortion by US Pt desires D&C Risks and benefits reviewed with the pt in detail  Marie Woodward A 04/02/2014, 11:38 AM

## 2014-04-02 NOTE — Transfer of Care (Signed)
Immediate Anesthesia Transfer of Care Note  Patient: Marie Woodward  Procedure(s) Performed: Procedure(s): DILATATION AND EVACUATION (N/A)  Patient Location: PACU  Anesthesia Type:MAC  Level of Consciousness: awake, alert  and oriented  Airway & Oxygen Therapy: Patient Spontanous Breathing and Patient connected to nasal cannula oxygen  Post-op Assessment: Report given to PACU RN, Post -op Vital signs reviewed and stable and Patient moving all extremities X 4  Post vital signs: Reviewed and stable  Complications: No apparent anesthesia complications

## 2014-04-02 NOTE — Op Note (Signed)
Pre op DX :  missed abortion at 10 weeks Postoperative Diagnosis: same Procedure:  dilation and evacuation Anesthesia:  MAC and local Surgeon:  Dr. Normand Sloopillard Asst : none Complications : none Procedure in detail: The patient was taken to the operating room where she was given MAC anesthesia. She was placed in dorsal lithotomy position and prepped and draped in normal sterile fashion. In and out catheter was used to drain the bladder. This was examined and noted to have a 10 week size uterus with no adnexal masses. A bivalve was placed into the vagina. A tenaculum was placed on the cervix. The cervix was infiltrated with 20 cc 1% lidocaine paracervical block. The cervix then dilated with dilators up to 21.  A size 10 suction curettage was placed into the uterine cavity. A scant amount of products of conception was seen. The suction curettage was removed when a gritty texture was noted. A sharp curette was done along all walls  of the uterus. The suction curet was placed back into the uterine cavity. No further products of conception were obtained. Sponge lap and needle counts were correct. Patient back in stable condition. The patient understood to be the risks to be, but not  limited to,  Bleeding,  Infection,  damage to internal organs by perforation of the uterus and Asherman syndrome (scarring in the uterus) leading to infertility.  Done with US guidance

## 2014-04-02 NOTE — Anesthesia Postprocedure Evaluation (Signed)
Anesthesia Post Note  Patient: Marie Woodward  Procedure(s) Performed: Procedure(s) (LRB): DILATATION AND EVACUATION (N/A)  Anesthesia type: MAC  Patient location: PACU  Post pain: Pain level controlled  Post assessment: Post-op Vital signs reviewed  Last Vitals:  Filed Vitals:   04/02/14 1231  BP:   Pulse: 63  Temp: 36.7 C  Resp:     Post vital signs: Reviewed  Level of consciousness: sedated  Complications: No apparent anesthesia complications

## 2014-04-02 NOTE — Discharge Instructions (Addendum)
Miscarriage °A miscarriage is the sudden loss of an unborn baby (fetus) before the 20th week of pregnancy. Most miscarriages happen in the first 3 months of pregnancy. Sometimes, it happens before a woman even knows she is pregnant. A miscarriage is also called a "spontaneous miscarriage" or "early pregnancy loss." Having a miscarriage can be an emotional experience. Talk with your caregiver about any questions you may have about miscarrying, the grieving process, and your future pregnancy plans. °CAUSES  °· Problems with the fetal chromosomes that make it impossible for the baby to develop normally. Problems with the baby's genes or chromosomes are most often the result of errors that occur, by chance, as the embryo divides and grows. The problems are not inherited from the parents. °· Infection of the cervix or uterus.   °· Hormone problems.   °· Problems with the cervix, such as having an incompetent cervix. This is when the tissue in the cervix is not strong enough to hold the pregnancy.   °· Problems with the uterus, such as an abnormally shaped uterus, uterine fibroids, or congenital abnormalities.   °· Certain medical conditions.   °· Smoking, drinking alcohol, or taking illegal drugs.   °· Trauma.   °Often, the cause of a miscarriage is unknown.  °SYMPTOMS  °· Vaginal bleeding or spotting, with or without cramps or pain. °· Pain or cramping in the abdomen or lower back. °· Passing fluid, tissue, or blood clots from the vagina. °DIAGNOSIS  °Your caregiver will perform a physical exam. You may also have an ultrasound to confirm the miscarriage. Blood or urine tests may also be ordered. °TREATMENT  °· Sometimes, treatment is not necessary if you naturally pass all the fetal tissue that was in the uterus. If some of the fetus or placenta remains in the body (incomplete miscarriage), tissue left behind may become infected and must be removed. Usually, a dilation and curettage (D and C) procedure is performed.  During a D and C procedure, the cervix is widened (dilated) and any remaining fetal or placental tissue is gently removed from the uterus. °· Antibiotic medicines are prescribed if there is an infection. Other medicines may be given to reduce the size of the uterus (contract) if there is a lot of bleeding. °· If you have Rh negative blood and your baby was Rh positive, you will need a Rh immunoglobulin shot. This shot will protect any future baby from having Rh blood problems in future pregnancies. °HOME CARE INSTRUCTIONS  °· Your caregiver may order bed rest or may allow you to continue light activity. Resume activity as directed by your caregiver. °· Have someone help with home and family responsibilities during this time.   °· Keep track of the number of sanitary pads you use each day and how soaked (saturated) they are. Write down this information.   °· Do not use tampons. Do not douche or have sexual intercourse until approved by your caregiver.   °· Only take over-the-counter or prescription medicines for pain or discomfort as directed by your caregiver.   °· Do not take aspirin. Aspirin can cause bleeding.   °· Keep all follow-up appointments with your caregiver.   °· If you or your partner have problems with grieving, talk to your caregiver or seek counseling to help cope with the pregnancy loss. Allow enough time to grieve before trying to get pregnant again.   °SEEK IMMEDIATE MEDICAL CARE IF:  °· You have severe cramps or pain in your back or abdomen. °· You have a fever. °· You pass large blood clots (walnut-sized   or larger) ortissue from your vagina. Save any tissue for your caregiver to inspect.   Your bleeding increases.   You have a thick, bad-smelling vaginal discharge.  You become lightheaded, weak, or you faint.   You have chills.  MAKE SURE YOU:  Understand these instructions.  Will watch your condition.  Will get help right away if you are not doing well or get  worse. Document Released: 10/20/2000 Document Revised: 08/21/2012 Document Reviewed: 06/15/2011 [facility] Patient Information 2015 [place], [place]. This information is not intended to replace advice given to you by your health care provider. Make sure you discuss any questions you have with your health care provider.    DO NOT TAKE ANY IBUPROFEN PRODUCTS UNTIL 6 PM TONIGHT.

## 2014-04-02 NOTE — Anesthesia Preprocedure Evaluation (Signed)
Anesthesia Evaluation  Patient identified by MRN, date of birth, ID band Patient awake    Reviewed: Allergy & Precautions, H&P , NPO status , Patient's Chart, lab work & pertinent test results  Airway Mallampati: I  TM Distance: >3 FB Neck ROM: full    Dental no notable dental hx. (+) Teeth Intact   Pulmonary neg pulmonary ROS,    Pulmonary exam normal       Cardiovascular negative cardio ROS      Neuro/Psych negative neurological ROS  negative psych ROS   GI/Hepatic negative GI ROS, Neg liver ROS,   Endo/Other  negative endocrine ROS  Renal/GU      Musculoskeletal   Abdominal Normal abdominal exam  (+)   Peds  Hematology negative hematology ROS (+)   Anesthesia Other Findings   Reproductive/Obstetrics negative OB ROS                             Anesthesia Physical Anesthesia Plan  ASA: II  Anesthesia Plan: MAC   Post-op Pain Management:    Induction: Intravenous  Airway Management Planned:   Additional Equipment:   Intra-op Plan:   Post-operative Plan:   Informed Consent: I have reviewed the patients History and Physical, chart, labs and discussed the procedure including the risks, benefits and alternatives for the proposed anesthesia with the patient or authorized representative who has indicated his/her understanding and acceptance.     Plan Discussed with: CRNA and Surgeon  Anesthesia Plan Comments:         Anesthesia Quick Evaluation

## 2014-04-03 ENCOUNTER — Encounter (HOSPITAL_COMMUNITY): Payer: Self-pay | Admitting: Obstetrics and Gynecology

## 2014-05-10 NOTE — L&D Delivery Note (Signed)
Delivery Note  1355: Nurse call reports patient with involuntary pushing and VE 9/100/0.  In room to assess, patient in mild distress and involuntarily pushing with contractions.  VE 9.5/100/-1,0.  Anterior lip reduced to C/C/-1,0.  Patient turned from supine to hands and knees with staff and doula assistance.  Encouraged to push and patient delivered as below.  Intermittent fetal monitoring reassuring.  At 2:06 PM, on Thursday Apr 03, 2015, a viable female "Marie Woodward" was delivered via Vaginal, Spontaneous Delivery (Presentation: Occiput Anterior).   After delivery of head, nuchal cord noted that shoulders and body was delivered through via somersault maneuver.  Infant with good tone and spontaneous cry and tactile stimulation given by provider.  Mother in hands/knees position and feeling unable to turn around.  Provider held infant and continued tactile stimulation and bulb suction.  Infant APGAR: 8, 9. Ifnant given to father who placed skin to skin whilst cord was clamped and cut.  Mother assisted into supine position and Vaginal inspection revealed a 1st degree perineal laceration that was repaired with 3-0 vicryl on SH.  Local anesthetic, Lidocaine 1%, necessary and patient tolerated the procedure well. Placenta delivered spontaneously and noted to be intact with 3VC upon inspection.   Fundus firm, at the umbilicus, and bleeding scant. Discussed need for frequent monitoring and vigorous fundal massage as patient opts for no uterotonics.  Patient verbalizes understanding. Mother hemodynamically stable and infant skin to skin prior to provider exit.  Mother desires NFP for birth control and opts to breastfeed.  Infant weight at one hour of life: 8lbs 14.7oz, 20.5in  Anesthesia: Local  Episiotomy: None Lacerations: 1st degree;Perineal Suture Repair: 3.0 vicryl Est. Blood Loss (mL): 38  Mom to postpartum.  Baby to Couplet care / Skin to Skin.  Cherre RobinsJessica L Abas Leicht MSN, CNM 04/03/2015, 3:01 PM

## 2014-09-12 LAB — OB RESULTS CONSOLE GC/CHLAMYDIA
CHLAMYDIA, DNA PROBE: NEGATIVE
GC PROBE AMP, GENITAL: NEGATIVE

## 2014-09-12 LAB — OB RESULTS CONSOLE RPR: RPR: NONREACTIVE

## 2014-09-12 LAB — OB RESULTS CONSOLE HIV ANTIBODY (ROUTINE TESTING): HIV: NONREACTIVE

## 2014-09-12 LAB — OB RESULTS CONSOLE GBS: GBS: POSITIVE

## 2014-09-12 LAB — OB RESULTS CONSOLE HEPATITIS B SURFACE ANTIGEN: HEP B S AG: NEGATIVE

## 2014-09-28 LAB — OB RESULTS CONSOLE RUBELLA ANTIBODY, IGM: RUBELLA: IMMUNE

## 2015-02-08 IMAGING — US US RENAL
2 series · 14 of 25 positions shown · non-contrast
Comparison: None

CLINICAL DATA: Hematuria, back pain

RENAL/URINARY TRACT ULTRASOUND COMPLETE

[Series 1: us renal · 2 of 7 slices shown (1 of 2)]
[im 1/7]
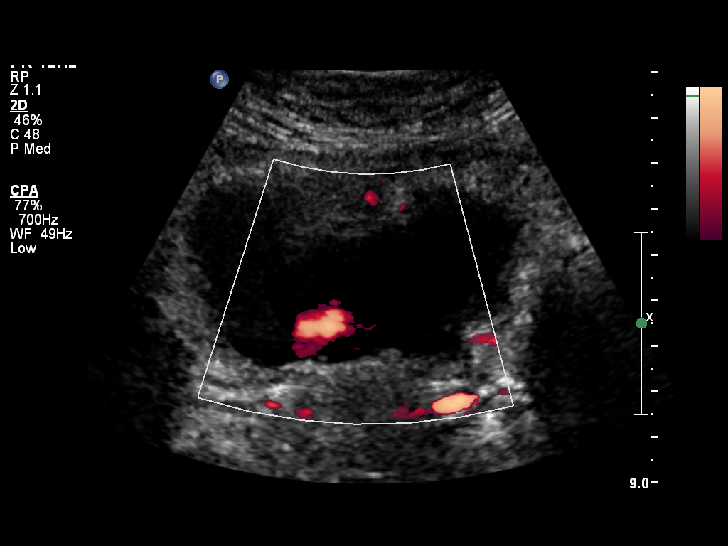
[im 7/7]
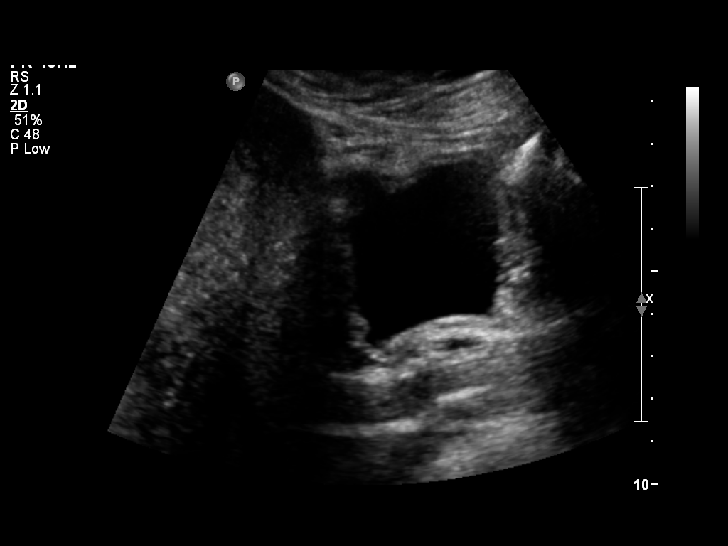

[Series 1: us renal · 12 of 44 slices shown (2 of 2)]
[im 3/44]
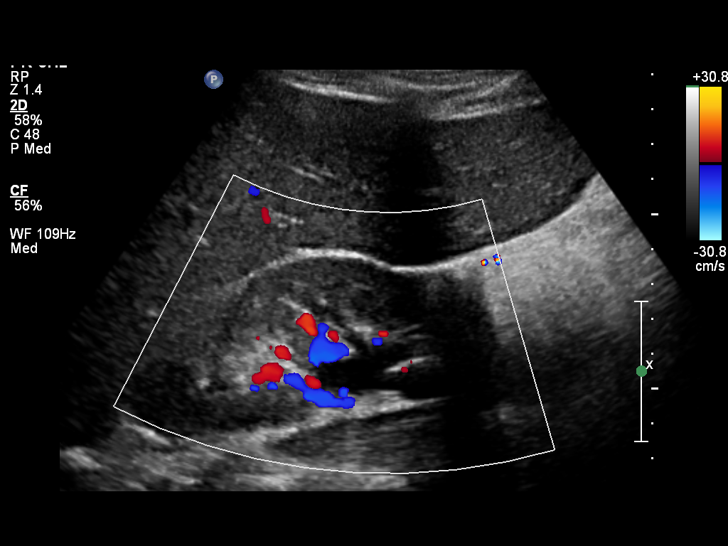
[im 7/44]
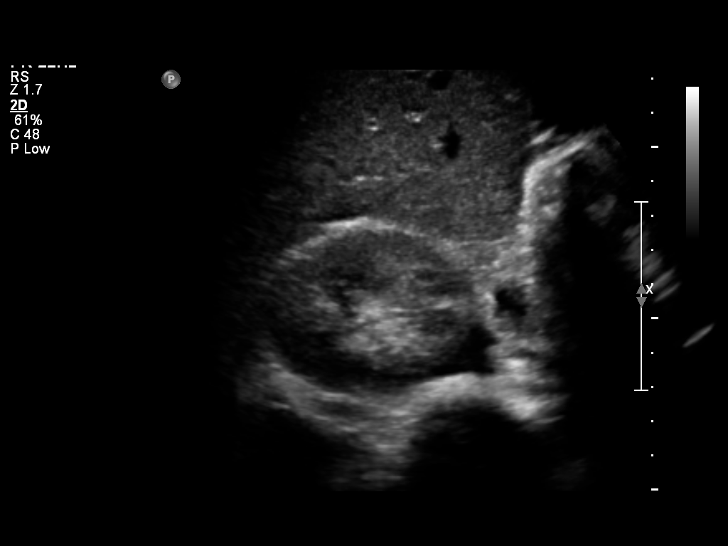
[im 11/44]
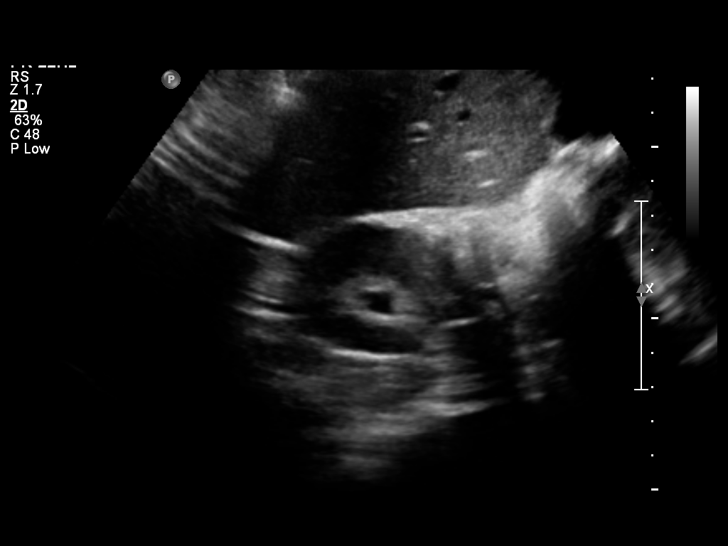
[im 13/44]
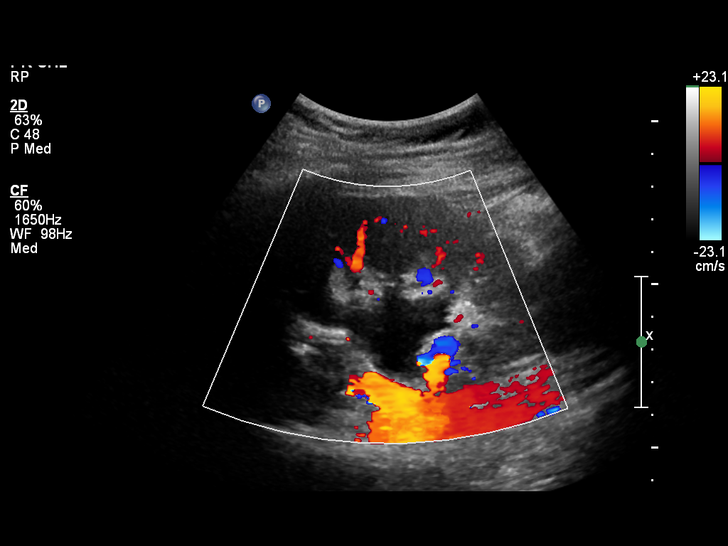
[im 17/44]
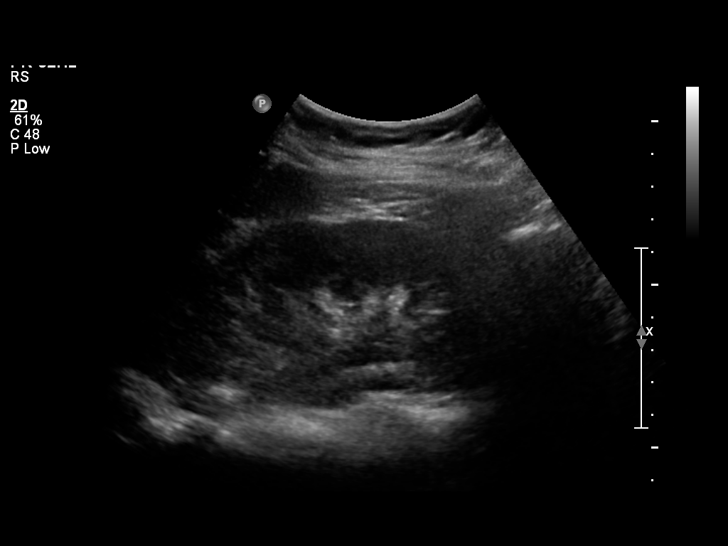
[im 21/44]
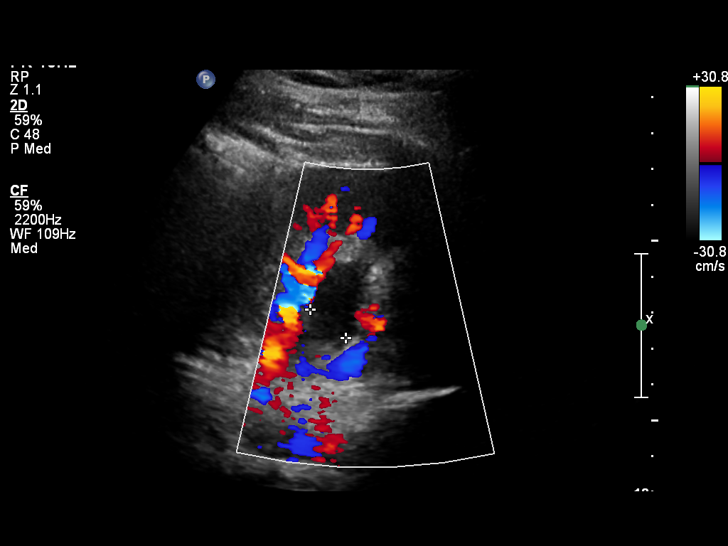
[im 25/44]
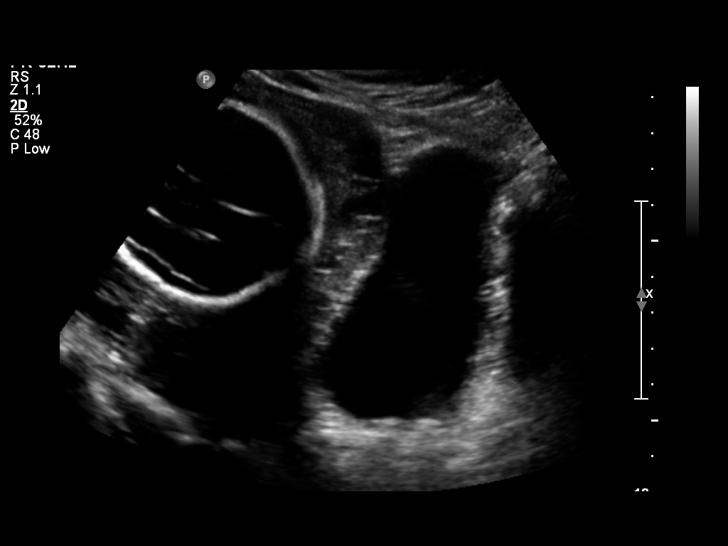
[im 27/44]
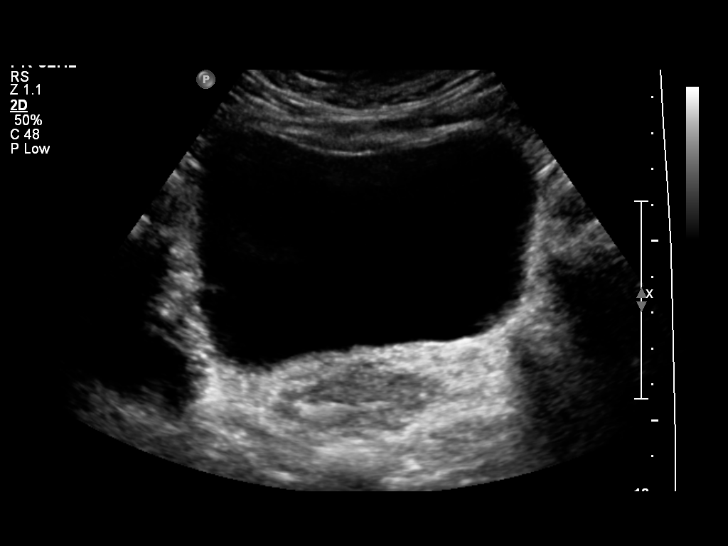
[im 31/44]
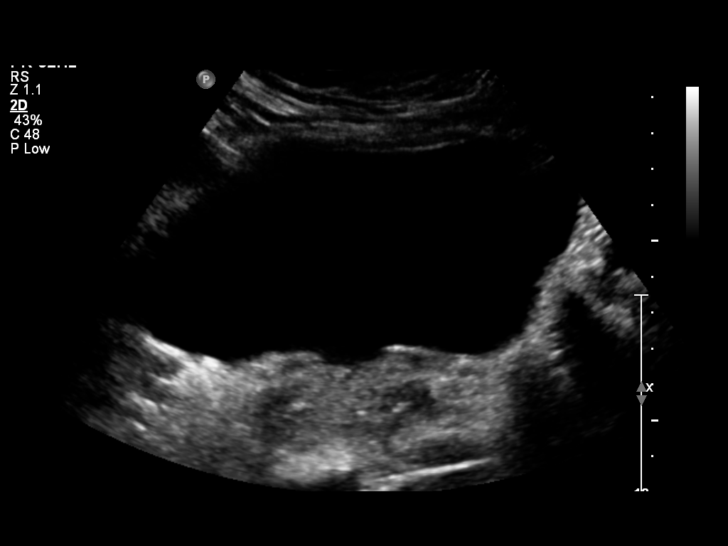
[im 35/44]
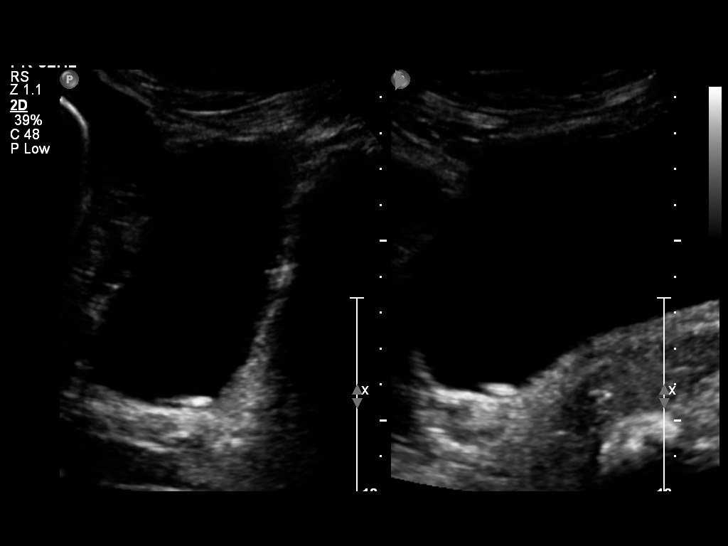
[im 39/44]
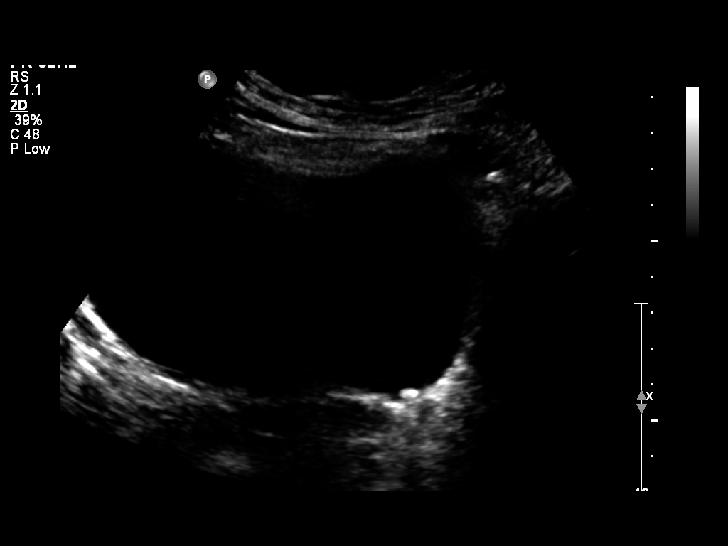
[im 44/44]
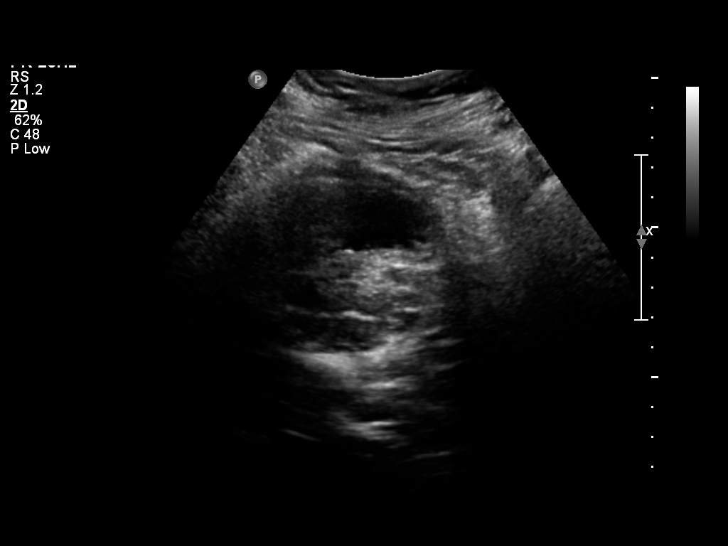

[14 of 25 positions shown; findings below may reference images not displayed]

FINDINGS: Right Kidney = 11.4 cm.  Mild hydronephrosis.  No calculi.

Left kidney = 11.1 cm.  Mild hydronephrosis.  No calculi

Bladder:  There is an echogenic foci within the posterior bladder
measuring 7 mm. This is mobile and consistent with small bladder
calculus.  Both  ureteral jets are identified indicating patent
vesicoureteral junctions.

The patient was given fluids and rescanned over the bladder..  The
calculus is not identified in the bladder on rescan.
IMPRESSION: 1..  Mild bilateral hydronephrosis is likely physiologic.
2.  Calculus within the bladder which disappeared upon voiding.

3. Bilateral ureteral jets identified.

## 2015-04-02 ENCOUNTER — Encounter (HOSPITAL_COMMUNITY): Payer: Self-pay

## 2015-04-02 ENCOUNTER — Inpatient Hospital Stay (HOSPITAL_COMMUNITY)
Admission: AD | Admit: 2015-04-02 | Discharge: 2015-04-05 | DRG: 775 | Disposition: A | Discharge status: Home / Self Care | Payer: No Typology Code available for payment source | Source: Ambulatory Visit | Attending: Obstetrics and Gynecology | Admitting: Obstetrics and Gynecology

## 2015-04-02 DIAGNOSIS — O26893 Other specified pregnancy related conditions, third trimester: Secondary | ICD-10-CM | POA: Diagnosis present

## 2015-04-02 DIAGNOSIS — O4292 Full-term premature rupture of membranes, unspecified as to length of time between rupture and onset of labor: Secondary | ICD-10-CM | POA: Diagnosis present

## 2015-04-02 DIAGNOSIS — O09523 Supervision of elderly multigravida, third trimester: Secondary | ICD-10-CM | POA: Diagnosis not present

## 2015-04-02 DIAGNOSIS — Z833 Family history of diabetes mellitus: Secondary | ICD-10-CM

## 2015-04-02 DIAGNOSIS — Z3A39 39 weeks gestation of pregnancy: Secondary | ICD-10-CM | POA: Diagnosis not present

## 2015-04-02 DIAGNOSIS — IMO0002 Reserved for concepts with insufficient information to code with codable children: Secondary | ICD-10-CM | POA: Diagnosis not present

## 2015-04-02 DIAGNOSIS — O4210 Premature rupture of membranes, onset of labor more than 24 hours following rupture, unspecified weeks of gestation: Secondary | ICD-10-CM | POA: Clinically undetermined

## 2015-04-02 DIAGNOSIS — O99824 Streptococcus B carrier state complicating childbirth: Secondary | ICD-10-CM | POA: Diagnosis present

## 2015-04-02 DIAGNOSIS — Z8249 Family history of ischemic heart disease and other diseases of the circulatory system: Secondary | ICD-10-CM | POA: Diagnosis not present

## 2015-04-02 DIAGNOSIS — O09299 Supervision of pregnancy with other poor reproductive or obstetric history, unspecified trimester: Secondary | ICD-10-CM

## 2015-04-02 DIAGNOSIS — O09529 Supervision of elderly multigravida, unspecified trimester: Secondary | ICD-10-CM

## 2015-04-02 DIAGNOSIS — B951 Streptococcus, group B, as the cause of diseases classified elsewhere: Secondary | ICD-10-CM | POA: Diagnosis present

## 2015-04-02 LAB — CBC
HCT: 37.3 % (ref 36.0–46.0)
Hemoglobin: 12.4 g/dL (ref 12.0–15.0)
MCH: 32.4 pg (ref 26.0–34.0)
MCHC: 33.2 g/dL (ref 30.0–36.0)
MCV: 97.4 fL (ref 78.0–100.0)
PLATELETS: 232 10*3/uL (ref 150–400)
RBC: 3.83 MIL/uL — ABNORMAL LOW (ref 3.87–5.11)
RDW: 14.5 % (ref 11.5–15.5)
WBC: 10.3 10*3/uL (ref 4.0–10.5)

## 2015-04-02 LAB — RPR: RPR Ser Ql: NONREACTIVE

## 2015-04-02 LAB — TYPE AND SCREEN
ABO/RH(D): O POS
Antibody Screen: NEGATIVE

## 2015-04-02 LAB — HIV ANTIBODY (ROUTINE TESTING W REFLEX): HIV Screen 4th Generation wRfx: NONREACTIVE

## 2015-04-02 LAB — ABO/RH: ABO/RH(D): O POS

## 2015-04-02 MED ORDER — SODIUM CHLORIDE 0.9 % IJ SOLN: 3.0000 mL | INTRAMUSCULAR | Status: DC | PRN

## 2015-04-02 MED ORDER — ONDANSETRON HCL 4 MG/2ML IJ SOLN: 4.0000 mg | Freq: Four times a day (QID) | INTRAMUSCULAR | Status: DC | PRN

## 2015-04-02 MED ORDER — SODIUM CHLORIDE 0.9 % IV SOLN: 250.0000 mL | INTRAVENOUS | Status: DC | PRN

## 2015-04-02 MED ORDER — LIDOCAINE HCL (PF) 1 % IJ SOLN
30.0000 mL | INTRAMUSCULAR | Status: AC | PRN
  Administered 2015-04-03: 30 mL via SUBCUTANEOUS
  Filled 2015-04-02: qty 30

## 2015-04-02 MED ORDER — OXYTOCIN 40 UNITS IN LACTATED RINGERS INFUSION - SIMPLE MED
62.5000 mL/h | INTRAVENOUS | Status: DC
  Filled 2015-04-02: qty 1000

## 2015-04-02 MED ORDER — CITRIC ACID-SODIUM CITRATE 334-500 MG/5ML PO SOLN
30.0000 mL | ORAL | Status: DC | PRN
  Administered 2015-04-03: 30 mL via ORAL
  Filled 2015-04-02: qty 15

## 2015-04-02 MED ORDER — OXYTOCIN BOLUS FROM INFUSION: 500.0000 mL | INTRAVENOUS | Status: DC

## 2015-04-02 MED ORDER — ACETAMINOPHEN 325 MG PO TABS: 650.0000 mg | ORAL_TABLET | ORAL | Status: DC | PRN

## 2015-04-02 MED ORDER — LACTATED RINGERS IV SOLN
INTRAVENOUS | Status: DC
Start: 2015-04-02 — End: 2015-04-03
  Administered 2015-04-02: 03:00:00 via INTRAVENOUS

## 2015-04-02 MED ORDER — OXYCODONE-ACETAMINOPHEN 5-325 MG PO TABS: 1.0000 | ORAL_TABLET | ORAL | Status: DC | PRN

## 2015-04-02 MED ORDER — FLEET ENEMA 7-19 GM/118ML RE ENEM: 1.0000 | ENEMA | RECTAL | Status: DC | PRN

## 2015-04-02 MED ORDER — CEFAZOLIN SODIUM 1-5 GM-% IV SOLN
1.0000 g | Freq: Three times a day (TID) | INTRAVENOUS | Status: DC
  Administered 2015-04-02 – 2015-04-03 (×4): 1 g via INTRAVENOUS
  Filled 2015-04-02 (×6): qty 50

## 2015-04-02 MED ORDER — SODIUM CHLORIDE 0.9 % IJ SOLN
3.0000 mL | Freq: Two times a day (BID) | INTRAMUSCULAR | Status: DC
  Administered 2015-04-02: 3 mL via INTRAVENOUS

## 2015-04-02 MED ORDER — FENTANYL CITRATE (PF) 100 MCG/2ML IJ SOLN
100.0000 ug | INTRAMUSCULAR | Status: DC | PRN
Start: 2015-04-02 — End: 2015-04-03

## 2015-04-02 MED ORDER — CEFAZOLIN SODIUM-DEXTROSE 2-3 GM-% IV SOLR
2.0000 g | Freq: Once | INTRAVENOUS | Status: AC
  Administered 2015-04-02: 2 g via INTRAVENOUS
  Filled 2015-04-02: qty 50

## 2015-04-02 MED ORDER — OXYCODONE-ACETAMINOPHEN 5-325 MG PO TABS: 2.0000 | ORAL_TABLET | ORAL | Status: DC | PRN

## 2015-04-02 MED ORDER — LACTATED RINGERS IV SOLN: 500.0000 mL | INTRAVENOUS | Status: DC | PRN

## 2015-04-02 NOTE — H&P (Signed)
Marie Woodward is a 39 y.o. female, G4P3 at 5039 weeks, presenting for SROM at 8pm, clear fluid, mild cramping since, small amount bloody show.  No recent VE.  Patient desires waterbirth and no augmentation of labor.  Hx of mild shoulder dystocia with last delivery, 9+3, but no maternal/fetal compromise.  Last 2 deliveries have been waterbirths.  Patient Active Problem List   Diagnosis Date Noted  . Normal labor 04/02/2015  . Positive GBS test 04/02/2015  . H/O shoulder dystocia in prior pregnancy, currently pregnant--mild 04/02/2015  . LGA (large for gestational age) fetus x 2 04/02/2015  . Kidney stone 06/27/2012  . Need for varicella vaccine 04/20/2012  . AMA (advanced maternal age) multigravida 35+ 02/28/2012    History of present pregnancy: Patient entered care at 1111 1/7 weeks.   EDC of 04/09/15 was established by LMP and was congruent with US at 11 6/7 weeks.   Anatomy scan: 19 2/7 weeks, with normal findings and a posterior placenta, EFW 87%ile, normal fluid, cervix 3.99.  Additional US evaluations: None.   Significant prenatal events:   Planned repeat waterbirth, did not need to attend class.  Declined genetic testing. Was on baby ASA until approx 14 weeks due to hx of recent SAB.  Signed WB consent forms at 28 1/7 weeks.  GBS positive on NOB urine--Rx'd, with TOC still positive, then repeat culture negative.  Patient declined recent cervical exams and repeat US.   Last evaluation:  03/27/15--BP 112/70, declined exam, S=D.  OB History    Gravida Para Term Preterm AB TAB SAB Ectopic Multiple Living   4 3 3       3     2010--SVB, 39 weeks, 7+4, [facility], [facility]SV[facility], 38 weeks, 9+2, waterbirth, female, IVF pregnancy, delivered with [facility] 2014--SVB, waterbirth, 38 6/7 weeks, 9+3, female, local for repair, 1st degree, mild shoulder dystocia, delivered with [facility].  Past Medical History  Diagnosis Date  . Heart murmur 1999    HAD CARDIAC EVAL  . Infection    UTI X1  . Infection     OCC YEAST  . Fetal macrosomia, delivered, current hospitalization 10/06/2012  . Kidney stones     passed stones - no surgery  . SVD (spontaneous vaginal delivery)     x 3   Past Surgical History  Procedure Laterality Date  . Wisdom tooth extraction    . Dilation and evacuation N/A 10/04/2013    Procedure: DILATATION AND EVACUATION;  Surgeon: Purcell NailsAngela Y Roberts, MD;  Location: [facility];  Service: Gynecology;  Laterality: N/A;  . Dilation and evacuation N/A 04/02/2014    Procedure: DILATATION AND EVACUATION;  Surgeon: Jaymes GraffNaima Dillard, MD;  Location: [facility];  Service: Gynecology;  Laterality: N/A;   Family History: family history includes Cancer in her paternal grandmother; Depression in her father; Diabetes in her father and paternal grandfather; Early death (age of onset: 450) in her mother; Heart disease in her maternal grandfather; Heart disease (age of onset: 7150) in her mother; Hyperlipidemia in her brother; Hypertension in her mother; Kidney disease in her father.   Social History:  reports that she has never smoked. She has never used smokeless tobacco. She reports that she does not drink alcohol or use illicit drugs. Patient is Caucasian, of the [place], married to [place], who is involved and supportive.  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   Prenatal Transfer Tool  Maternal Diabetes: No Genetic Screening: Declined Maternal Ultrasounds/Referrals: Normal  Fetal Ultrasounds or other Referrals:  None Maternal Substance Abuse:  No Significant Maternal Medications:  None Significant Maternal Lab Results: Lab values include: Group B Strep positive  TDAP Declined Flu Declined  ROS:  Leaking clear fluid, mild cramping, +FM.  No Known Allergies   Dilation: 2.5 Effacement (%): 90 Station: -2 Exam by:: Manfred Arch CNM Blood pressure 125/74, pulse 83, temperature 98 F (36.7 C), temperature source Oral, height   (1.702 m), weight 86.183 kg (190 lb), unknown if currently breastfeeding.  Chest clear Heart RRR without murmur Abd gravid, NT, FH 38 cm Pelvic: As above, leaking clear fluid Ext: WNL  FHR: Category 1 UCs:  q 2-4 min, mild.  Prenatal labs: ABO, Rh: --/--/O POS (11/23 0230)O+ Antibody: NEG (11/23 0230)Neg Rubella:   RPR: Nonreactive (05/05 0000) Immune HBsAg: Negative (05/05 0000) Neg HIV: Non-reactive (05/05 0000) NR GBS: Positive (05/05 0000)Positive 09/12/14--TOC still positive 10/17/14, negative 11/02/14 Sickle cell/Hgb electrophoresis:  NA Pap:  09/2013 WNL GC: Negative 09/12/14 on urine  Chlamydia:  Negative 09/12/14 Genetic screenings:  Declined Glucola:  WNL Other:   Hgb 12.7 at NOB, 11.5 at 28 weeks Varicella non-immune    Assessment/Plan: IUP at 39 weeks PROM at term GBS positive Hx mild shoulder dystocia AMA--declined testing Planning repeat waterbirth  Plan: Admit to Birthing Suite per consult with Dr. Charlotta Newton Routine [facility] orders Declines use of pain med Ancef for GBS prophylaxis per patient request. Intermittent monitoring R&B of augmentation reviewed with patient and husband, including prolonged labor, increased risk of infection >24 hours after ROM, chorioamnionitis.  Patient declines augmentation. Risk of recurrence of shoulder dystocia reviewed with patient and husband--they understand and accept need to deliver on land if labor progress is abnormal.  Sejla Marzano, CNM, [place] 04/02/2015, [phone]

## 2015-04-02 NOTE — Progress Notes (Signed)
Subjective: Pt  Sitting in bed, visiting with family and doula.  States " waiting for something to get started"  Reports mild non painful contractions.  Inquiring about showering and when to labor in tub.    Objective: BP 127/73 mmHg  Pulse 84  Temp(Src) 97.4 F (36.3 C) (Oral)  Resp 18  Ht 5\' 7"  (1.702 m)  Wt 86.183 kg (190 lb)  BMI 29.75 kg/m2      FHT: Category 1 , 140 bpm, moderate varaibility, +accels, no decels UC:   irregular, every 3-4 minutes, mild SVE:   Dilation: 2.5 Effacement (%): 90 Station: -2 Exam by:: Manfred ArchV. Latham CNM GBS prophylaxis , ANCEF x 1 dose (0240) SROM  At 2000 on 04/01/15  Assessment:  Latent/early labor GBS positive Desires waterbirth  Declines augmentation Cat 1 FHT  SROM x 15hrs    Plan: May resume intermittent fetal monitoring - NST q2hrs May shower Encouraged to walk in halls Wait to labor in tub until active labor Continue to observe   Alphonzo Severanceachel Shamina Etheridge CNM, [place] 04/02/2015, 1:09 PM

## 2015-04-02 NOTE — Progress Notes (Signed)
Subjective: Sitting up in bed visiting with family at bedside.  Feels contractions every 3-4 minutes, states they are getting stronger.  Smiling and apprears in good spirits.   Objective: BP 108/77 mmHg  Pulse 111  Temp(Src) 97.8 F (36.6 C) (Oral)  Resp 16  Ht 5\' 7"  (1.702 m)  Wt 86.183 kg (190 lb)  BMI 29.75 kg/m2      Filed Vitals:   04/02/15 0406 04/02/15 0410 04/02/15 0605 04/02/15 0838  BP:  125/74  108/77  Pulse:  83  111  Temp:  98 F (36.7 C) 98 F (36.7 C) 97.8 F (36.6 C)  TempSrc:  Oral Oral Oral  Resp:    16  Height: 5\' 7"  (1.702 m)     Weight: 86.183 kg (190 lb)       FHT: Category  1, 160 bpm, moderate variability, +accels, no decels,  Baseline change from NST at 6Am.   UC:  ir regular, every 1-3 minutes, palpate mild SVE:   Dilation: 2.5 Effacement (%): 90 Station: -2 Exam by:: Manfred ArchV. Latham CNM GBS prophylaxis , ANCEF x 1 dose (0240)  Assessment:  Latent/early labor GBS positive Desires waterbirth Declines augmentation Cat 1 FHT  Plan: Watch FHR due to baseline change- intermittent monitoring q1 hr May walk in halls Continue current plan.   Alphonzo Severanceachel Mikael Debell CNM, [place] 04/02/2015, 9:43 AM

## 2015-04-02 NOTE — Progress Notes (Addendum)
1715 pt returns from ambulating outside. Requesting sve by cnm

## 2015-04-02 NOTE — Progress Notes (Signed)
  Subjective: Resting quietly--"can't sleep, but want to rest".  Patient anticipates ambulating later this am to help facilitate labor.  Reports this labor is just like her previous two labors, with PROM at term.  Both times, she walked, and with time, labor progressed spontaneously.  Husband and mother-in-law sleeping at bedside.  Objective: BP 125/74 mmHg  Pulse 83  Temp(Src) 98 F (36.7 C) (Oral)  Ht 5\' 7"  (1.702 m)  Wt 86.183 kg (190 lb)  BMI 29.75 kg/m2    1st dose Ancef given at 0240--tolerated well.  FHT: Category 1 on intermittent monitoring--NST q 2 hours. UC:   irregular, every 2-4 minutes, mild. SVE:   Dilation: 2.5 Effacement (%): 90 Station: -2 Exam by:: Manfred ArchV. Insiya Oshea CNM at [phone] Leaking clear fluid  Assessment:  Latent/early labor GBS positive Desires waterbirth Declines augmentation  Plan: Continue current plan.  [name], Shadeed Colberg CNM 04/02/2015, 5:27 AM

## 2015-04-02 NOTE — Progress Notes (Addendum)
Subjective: Pt walking and using labor ball to stimulate contractions.  States they are coming and going away but does not have any consist contraction pattern. Inquiring about getting in the tub  Objective: BP 123/81 mmHg  Pulse 90  Temp(Src) 97.5 F (36.4 C) (Oral)  Resp 18  Ht 5\' 7"  (1.702 m)  Wt 86.183 kg (190 lb)  BMI 29.75 kg/m2      Filed Vitals:   04/02/15 0605 04/02/15 0838 04/02/15 1118 04/02/15 1505  BP:  108/77 127/73 123/81  Pulse:  111 84 90  Temp: 98 F (36.7 C) 97.8 F (36.6 C) 97.4 F (36.3 C) 97.5 F (36.4 C)  TempSrc: Oral Oral Oral Oral  Resp:  16 18 18   Height:      Weight:        FHT: Category 1 (baseline 155 bpm) on intermittent monitoring--NST q 2 hours. UC:   Occasional periods of contractions SVE: Deffered- last check at 0211 SVE at 0211 :   Dilation: 2.5 Effacement (%): 90 Station: -2 Exam by:: Manfred ArchV. Latham CNM SROM/PROM  2000 on 04/01/15 GBS prophylaxis:  Ancef x2 doses   Assessment:  Latent/early labor GBS positive Desires waterbirth Declines augmentation SROM x 21 hrs  Plan: Discussed with patient, R&B,  about the possibility of using Pitocin to augment labor. Pt declines at this time Discussed PROM and increased risk of infection after 24 hrs  Encouraged to walk in halls Wait to labor in tub until active labor Continue current plan.  Alphonzo Severanceachel Einar Nolasco CNM, [place] 04/02/2015, 4:58 PM  Addendum  Pt returned from ambulating outside.   Requested a vaginal exam.   SVE: 2/60/-2   NST:   Cat 1, 150 bpm, moderate variability, +Accels, no decels UC   Irregular, 1-3 min, mild   Assessment and Plan unchanged  Alphonzo Severanceachel Jerimy Johanson, CNM, MSN 04/02/2015 1801

## 2015-04-02 NOTE — Progress Notes (Addendum)
  Subjective[facility]  Planning use of doula, Marie Woodward, who will be tub manager--they are deferring set up of tub at present due to latent labor.  Objective:   T 98, P 83, BP 125/74  FHT: Category 1 UC:   Occasional, mild SVE:   Dilation: 2.5 Effacement (%): 90 Station: -2 Exam by:: Marie ArchV. Marie Woodward CNM at [phone] Leaking clear fluid   Assessment:  IUP at 39 weeks PROM at term GBS positive Planning waterbirth Declines augmentation. Hx mild shoulder dystocia  Plan: Continue GBS prophylaxis with Ancef per patient preference. Intermittent monitoring OK for waterbirth Patient accepts risk of prolonged ROM, protracted labor, chorioramnionitis--declines augmentation. Signed WB consents x 2 on shadow chart.  Marie Woodward, Marie Woodward CNM 04/02/2015, [phone]

## 2015-04-03 ENCOUNTER — Encounter (HOSPITAL_COMMUNITY): Payer: Self-pay | Admitting: *Deleted

## 2015-04-03 DIAGNOSIS — O4210 Premature rupture of membranes, onset of labor more than 24 hours following rupture, unspecified weeks of gestation: Secondary | ICD-10-CM | POA: Clinically undetermined

## 2015-04-03 MED ORDER — LANOLIN HYDROUS EX OINT: TOPICAL_OINTMENT | CUTANEOUS | Status: DC | PRN

## 2015-04-03 MED ORDER — TERBUTALINE SULFATE 1 MG/ML IJ SOLN
0.2500 mg | Freq: Once | INTRAMUSCULAR | Status: DC | PRN
  Filled 2015-04-03: qty 1

## 2015-04-03 MED ORDER — IBUPROFEN 600 MG PO TABS
600.0000 mg | ORAL_TABLET | Freq: Four times a day (QID) | ORAL | Status: DC
  Administered 2015-04-03 – 2015-04-05 (×7): 600 mg via ORAL
  Filled 2015-04-03 (×7): qty 1

## 2015-04-03 MED ORDER — TETANUS-DIPHTH-ACELL PERTUSSIS 5-2.5-18.5 LF-MCG/0.5 IM SUSP: 0.5000 mL | Freq: Once | INTRAMUSCULAR | Status: DC

## 2015-04-03 MED ORDER — OXYCODONE-ACETAMINOPHEN 5-325 MG PO TABS: 2.0000 | ORAL_TABLET | ORAL | Status: DC | PRN

## 2015-04-03 MED ORDER — PRENATAL MULTIVITAMIN CH
1.0000 | ORAL_TABLET | Freq: Every day | ORAL | Status: DC
  Administered 2015-04-04: 1 via ORAL
  Filled 2015-04-03: qty 1

## 2015-04-03 MED ORDER — SENNOSIDES-DOCUSATE SODIUM 8.6-50 MG PO TABS
2.0000 | ORAL_TABLET | ORAL | Status: DC
  Administered 2015-04-03 – 2015-04-04 (×2): 2 via ORAL
  Filled 2015-04-03 (×2): qty 2

## 2015-04-03 MED ORDER — DIBUCAINE 1 % RE OINT: 1.0000 "application " | TOPICAL_OINTMENT | RECTAL | Status: DC | PRN

## 2015-04-03 MED ORDER — OXYCODONE-ACETAMINOPHEN 5-325 MG PO TABS: 1.0000 | ORAL_TABLET | ORAL | Status: DC | PRN

## 2015-04-03 MED ORDER — MISOPROSTOL 200 MCG PO TABS
50.0000 ug | ORAL_TABLET | ORAL | Status: DC | PRN
  Administered 2015-04-03 (×2): 50 ug via ORAL
  Filled 2015-04-03 (×5): qty 0.5

## 2015-04-03 MED ORDER — SIMETHICONE 80 MG PO CHEW: 80.0000 mg | CHEWABLE_TABLET | ORAL | Status: DC | PRN

## 2015-04-03 MED ORDER — DIPHENHYDRAMINE HCL 25 MG PO CAPS: 25.0000 mg | ORAL_CAPSULE | Freq: Four times a day (QID) | ORAL | Status: DC | PRN

## 2015-04-03 MED ORDER — ACETAMINOPHEN 325 MG PO TABS: 650.0000 mg | ORAL_TABLET | ORAL | Status: DC | PRN

## 2015-04-03 MED ORDER — WITCH HAZEL-GLYCERIN EX PADS: 1.0000 "application " | MEDICATED_PAD | CUTANEOUS | Status: DC | PRN

## 2015-04-03 MED ORDER — BENZOCAINE-MENTHOL 20-0.5 % EX AERO
1.0000 "application " | INHALATION_SPRAY | CUTANEOUS | Status: DC | PRN
  Administered 2015-04-03: 1 via TOPICAL
  Filled 2015-04-03 (×2): qty 56

## 2015-04-03 MED ORDER — ONDANSETRON HCL 4 MG PO TABS: 4.0000 mg | ORAL_TABLET | ORAL | Status: DC | PRN

## 2015-04-03 MED ORDER — ONDANSETRON HCL 4 MG/2ML IJ SOLN: 4.0000 mg | INTRAMUSCULAR | Status: DC | PRN

## 2015-04-03 MED ORDER — ZOLPIDEM TARTRATE 5 MG PO TABS: 5.0000 mg | ORAL_TABLET | Freq: Every evening | ORAL | Status: DC | PRN

## 2015-04-03 NOTE — Progress Notes (Signed)
Marie PaulsBrandi Mangini MRN: [id number]  Subjective: -Nurse call reports patient desire to get in birthing tub.  In room to assess.  Patient reports contractions stronger and more frequent since AROM. Requests VE.  Husband, Doula, Mother, and 39y.o. Daughter at bedside.   Objective: BP 119/74 mmHg  Pulse 99  Temp(Src) 98 F (36.7 C) (Oral)  Resp 18  Ht 5\' 7"  (1.702 m)  Wt 86.183 kg (190 lb)  BMI 29.75 kg/m2 I/O last 3 completed shifts: In: 3 [I.V.:3] Out: -     Fetal Monitoring: FHT: 145 bpm, Min Var, +Occasional Decels, +Accels  UC: Q2-485min, palpates moderate    Vaginal Exam: SVE:   Dilation: 5 Effacement (%): 70 Station: Ballotable Exam by:: Sabas SousJ. Aliza Moret CNM Membranes:SROM x 39hrs Internal Monitors: None  Augmentation/Induction: Pitocin:None Cytotec: S/P 2 Doses  Assessment:  IUP at 39.1wks Cat I FT Overall-Minimal Variability s/t infant sleep cycle GBS Positive Afebrile Prolonged ROM Active Labor  Plan: -Okay to fill WB tub and labor in tub -Doula instructed to maintain water temperature as per protocol, 99-100, despite inability to deliver in tub -Await for reactive NST prior to getting in tub -Instructed to notify nurse if desire to push or constant rectal pressure occurs -No questions or concerns -Continue other mgmt as ordered  Valma CavaJessica L Serrena Linderman,MSN, CNM 04/03/2015, 11:43 AM

## 2015-04-03 NOTE — Progress Notes (Signed)
Labor Progress  Subjective: Pt sleeping, FOB on the sofa.  Spoke to pt about augmentation with PO cytotec. She was concerned that she could no longer do a water birth.  She was assured as long as she progress on her own, FHR was category 1 and did not need pitocin she would still be able to have a water birth  Objective: BP 124/72 mmHg  Pulse 82  Temp(Src) 97.8 F (36.6 C) (Oral)  Resp 18  Ht 5\' 7"  (1.702 m)  Wt 190 lb (86.183 kg)  BMI 29.75 kg/m2   Total I/O In: 3 [I.V.:3] Out: -  FHT: 150, moderate variability, + accel, no decel CTX:  occassional Uterus gravid, soft non tender SVE:  Dilation: 2 Effacement (%): Thick Station: Ballotable Exam by:: Duha Abair cnm  Assessment:  IUP at 39.0 weeks NICHD: Category  Membranes:  SROM x 28hrs, no s/s of infection Labor progress: Inadquate labor progress GBS: positive   Plan: Continue labor plan Continuous monitoring Rest cytotec PO Will reassess with cervical exam at 0500 or earlier if necessary      Marie Woodward, CNM, MSN 04/03/2015. 12:02 AM

## 2015-04-03 NOTE — Progress Notes (Signed)
Labor Progress  Subjective: Pt report noticeable ctx  Objective: BP 117/68 mmHg  Pulse 67  Temp(Src) 97.3 F (36.3 C) (Oral)  Resp 18  Ht 5\' 7"  (1.702 m)  Wt 190 lb (86.183 kg)  BMI 29.75 kg/m2   Total I/O In: 3 [I.V.:3] Out: -  FHT: 140, moderate variability, + accel, no decel CTX:  irregular, every 4-6 minutes Uterus gravid, soft non tender SVE:  Dilation: 3 Effacement (%): 30 Station: -3 Exam by:: [place] Dube Cnm   Assessment:  IUP at 39.1 weeks NICHD: Category 1 Membranes:  SROM x 32hrs, no s/s of infection Labor progress: Inadquate labor GBS: positive  Plan: Continue labor plan Continuous monitoring Rest Frequent position changes to facilitate fetal rotation and descent. Will reassess with cervical exam at 0700 or earlier if necessary Cytotec #2      Maddalena Linarez, CNM, MSN 04/03/2015. 5:11 AM

## 2015-04-03 NOTE — Progress Notes (Addendum)
Marie PaulsBrandi Arauz MRN: [id number]  Subjective: 0800: Care assumed of 39y.o. [id number] at 39.1wks who presents for SROM on 11/21 at 2000. In room to meet acquaintance. Patient reports regular contractions, but states they are not consistent regarding intensity.  Patient expresses desire to defer pitocin usage if possible.  Open to another dose of cytotec, but has some hesitations with this. Encouraged to eat, shower, and provider will return to reassess at 0900.  Husband and doula at bedside.  0900:  In to discuss POC and assess patient s/p consult with attending MD.  Dr. Kathie RhodesS. Rivard expresses concern regarding patient's history of LGA and her personal report of a mild SD with last delivery, which progressed rapidly and was attended by RN until providers and MD arrived shortly after.  Dr. Estanislado Pandyivard states patient unable to deliver in water but is free to labor in water as she desires.  Provider discussed, extensively, MD decision for eliminating waterbirth secondary to risk of SD including limited mobility and delay in maneuvers to successfully resolve SD.  Patient verbalizes understanding and states she feels that "everything will be okay," but respects MD decision.  Patient offered opportunity to speak with Dr. Estanislado Pandyivard, personally, regarding her decision, but felt that this was not necessary.  Patient further informed of MD desire to strongly recommend pitocin augmentation in absence of infection as a means of being proactive and promoting labor prior to infection and/or s/s of fetal compromise. Patient extensively educated on mechanisms of cytotec as a ripening agent in regards to labor augmentation and informed that dosing is not appropriate as a means of promoting regular contractions.  Discussed r/b of pitocin and further compared it's mechanisms of action to that of cytotec. Husband with many questions, which was answered and understood fully per his personal reflection.  Patient currently desires cervical exam and  would like to delay pitocin usage for at least 2 hours, if necessary.  Objective: BP 109/64 mmHg  Pulse 85  Temp(Src) 97.7 F (36.5 C) (Oral)  Resp 18  Ht 5\' 7"  (1.702 m)  Wt 86.183 kg (190 lb)  BMI 29.75 kg/m2 I/O last 3 completed shifts: In: 3 [I.V.:3] Out: -     Fetal Monitoring: FHT: 145 bpm, Mod Var, -Decels, +Accels UC: Q2-617min, palpates mild    Vaginal Exam: SVE:   Dilation: 4.5 Effacement (%): 30 Station: Ballotable Exam by:: Sabas SousJ. Waunetta Riggle CNM Membranes:Forebag bulging and AROM'd with Large Amt Clear (some Milky White), Warm Fluid Internal Monitors: None  Augmentation/Induction: Pitocin:None Cytotec: s/p 2 doses  Assessment:  IUP at 39.1wks Cat I FT  Latent Labor Amniotomy  GBS Positive H/O SD  Plan: -Patient declines cytotec and pitocin at current -Allow to progress in presence of amniotomy of forebag -Fetal Monitoring Hrly for 20 minutes or until NST reactive -Continue other mgmt as ordered -Will update Dr. Estanislado Pandyivard accordingly -Will reassess prn  Valma CavaJessica L Harlene Petralia,MSN, CNM 04/03/2015, 9:47 AM

## 2015-04-03 NOTE — Progress Notes (Addendum)
Marie PaulsBrandi Woodward MRN: [id number]  Subjective: -Nurse call out and reports patient with urge to push.  In room to assess. Patient in bed states she had urge while sitting on toilet.   Objective: BP 108/71 mmHg  Pulse 72  Temp(Src) 98.2 F (36.8 C) (Oral)  Resp 20  Ht 5\' 7"  (1.702 m)  Wt 86.183 kg (190 lb)  BMI 29.75 kg/m2 I/O last 3 completed shifts: In: 3 [I.V.:3] Out: -     Fetal Monitoring: FHT: Assessing UC: palpates moderate    Vaginal Exam: SVE:   Dilation: 5 Effacement (%): 80 Station: -1 Exam by:: Sabas SousJ. Nathanel Tallman CNM  Fetal Head well applied Membranes: SROM x 41hrs Internal Monitors: None  Augmentation/Induction: Pitocin:None Cytotec: S/P 2 doses  Assessment:  IUP at 39.1wks Active Labor Afebrile  Plan: -Informed of exam -Will monitor while out of tub -Encouraged to breath through that urge to push as to avoid cervical edema -No needs at current -Continue other mgmt as ordered  Valma CavaJessica L Arseniy Toomey,MSN, CNM 04/03/2015, 1:17 PM   S: Patient with urge to push. O: 7/80/-1 A: Transitional Labor P: Anticipate SVD Okay to get back in water  Ryoma Nofziger L Ahsley Attwood 1:45 PM

## 2015-04-04 LAB — CBC
HEMATOCRIT: 33.2 % — AB (ref 36.0–46.0)
HEMOGLOBIN: 10.7 g/dL — AB (ref 12.0–15.0)
MCH: 31.8 pg (ref 26.0–34.0)
MCHC: 32.2 g/dL (ref 30.0–36.0)
MCV: 98.8 fL (ref 78.0–100.0)
Platelets: 208 10*3/uL (ref 150–400)
RBC: 3.36 MIL/uL — AB (ref 3.87–5.11)
RDW: 14.8 % (ref 11.5–15.5)
WBC: 11.5 10*3/uL — ABNORMAL HIGH (ref 4.0–10.5)

## 2015-04-04 NOTE — Lactation Note (Signed)
This note was copied from the chart of Marie Rocco PaulsBrandi Berenguer. Lactation Consultation Note  P4.  Ex BF.  Breastfed other children for approx 1 year each. Baby has BFx10, 5 stools, 4 voids in first 21 hours of life. Mother states at approx 4 months with 1st two children and with 6 months with 3rd child her milk supply started to decrease. She has tried fenugreek, power pumping, and other methods to increase her milk supply. So mother is going to rent a DEBP hospital grade breast pump for [employer] store to start post pumping to boost her supply from the start. Discussed cluster feeding and basics.  Observed wide open latch and then mother stopped because baby stooled. Left brochure and discussed outpatient services if needed. Offered manual pump but mother states she has one.    Patient Name: Marie Woodward ZOXWR'UToday's Date: 04/04/2015 Reason for consult: Initial assessment   Maternal Data Has patient been taught Hand Expression?: Yes Does the patient have breastfeeding experience prior to this delivery?:  (1 year approx with each)  Feeding Feeding Type: Breast Fed Length of feed: 20 min  LATCH Score/Interventions Latch: Grasps breast easily, tongue down, lips flanged, rhythmical sucking.  Audible Swallowing: A few with stimulation  Type of Nipple: Everted at rest and after stimulation  Comfort (Breast/Nipple): Soft / non-tender     Hold (Positioning): No assistance needed to correctly position infant at breast.  LATCH Score: 9  Lactation Tools Discussed/Used     Consult Status Consult Status: PRN    Hardie PulleyBerkelhammer, Ruth Boschen 04/04/2015, 12:18 PM

## 2015-04-04 NOTE — Progress Notes (Signed)
Subjective: Postpartum Day 1: Vaginal delivery, 1rst degree perineal laceration Patient up ad lib, reports no syncope or dizziness. Feeding:  Breast Contraceptive plan:  NFP  Objective: Vital signs in last 24 hours: Temp:  [97.4 F (36.3 C)-98.3 F (36.8 C)] 97.4 F (36.3 C) (11/25 0620) Pulse Rate:  [72-99] 73 (11/25 0620) Resp:  [16-20] 16 (11/25 0620) BP: (108-136)/(52-79) 115/79 mmHg (11/25 0620) SpO2:  [98 %] 98 % (11/24 1800)  Physical Exam:  General: alert and cooperative Lochia: appropriate Uterine Fundus: firm Perineum: healing well DVT Evaluation: No evidence of DVT seen on physical exam. Negative Homan's sign.   CBC Latest Ref Rng 04/04/2015 04/02/2015 04/02/2014  WBC 4.0 - 10.5 K/uL 11.5(H) 10.3 5.3  Hemoglobin 12.0 - 15.0 g/dL 10.7(L) 12.4 11.9(L)  Hematocrit 36.0 - 46.0 % 33.2(L) 37.3 36.1  Platelets 150 - 400 K/uL 208 232 208     Assessment/Plan: Status post vaginal delivery day 1. Stable Breastfeeding Infant will remain overnight Continue current care. Plan for discharge tomorrow   Beatrix FettersRachel StallCNM 04/04/2015, 11:05 AM

## 2015-04-05 MED ORDER — IBUPROFEN 600 MG PO TABS: 600.0000 mg | ORAL_TABLET | Freq: Four times a day (QID) | ORAL | Status: AC

## 2015-04-05 MED ORDER — OXYCODONE-ACETAMINOPHEN 5-325 MG PO TABS: 1.0000 | ORAL_TABLET | ORAL | Status: AC | PRN

## 2015-04-05 MED ORDER — FERROUS SULFATE 325 (65 FE) MG PO TABS: 325.0000 mg | ORAL_TABLET | Freq: Two times a day (BID) | ORAL | Status: DC

## 2015-04-05 NOTE — Lactation Note (Signed)
This note was copied from the chart of Marie Rocco PaulsBrandi Hari. Lactation Consultation Note Experienced BF mom states she had low milk supply issues at 4 months with her other 3 children. States its a pattern. Mom states she BF on both breast at each feeding. Discussed supply and demand, foremilk and hind milk. Discussed trying to BF on one breast each feeding and massage breast to get hind milk out. Maybe baby's weren't emptying breast enough and getting hind milk if mom BF on both breast, she wants to empty one breast as much as possible to get hind milk to baby then if baby is still hungry top other breast off. Also mom can pump other breast to store and build up milk supply as well. Mom has rented a hospital grade DEBP and will pump as well. Made a suggestion to maybe come and have milk calorie intake measured w/LC. Reminded of LC out pt. Services.  Patient Name: Marie Woodward [id number]'WToday's Date: 04/05/2015 Reason for consult: Follow-up assessment   Maternal Data Does the patient have breastfeeding experience prior to this delivery?: Yes  Feeding    LATCH Score/Interventions                      Lactation Tools Discussed/Used     Consult Status Consult Status: Complete Date: 04/05/15    Charyl DancerCARVER, Zell Hylton G 04/05/2015, 9:20 AM

## 2015-04-05 NOTE — Discharge Summary (Signed)
OB Discharge Summary  Patient Name: Marie Woodward DOB: 1975/06/19 MRN: [id number]  Date of admission: 04/02/2015 Delivering MD: Gerrit Heck   Date of discharge: 04/05/2015  Admitting diagnosis: 38 WEEKS CTX Intrauterine pregnancy: [redacted]w[redacted]d     Secondary diagnosis:Principal Problem:   SVD (spontaneous vaginal delivery) Active Problems:   AMA (advanced maternal age) multigravida 35+   Normal labor   Positive GBS test   H/O shoulder dystocia in prior pregnancy, currently pregnant--mild   LGA (large for gestational age) fetus x 2   First degree perineal laceration during delivery   Prolonged rupture of membranes, greater than 24 hours, delivered  Additional problems:n/a     Discharge diagnosis: Term Pregnancy Delivered                                                                     Post partum procedures:n/a  Augmentation: Cytotec  Complications: ROM>24 hours  Hospital course:  Onset of Labor With Vaginal Delivery     39 y.o. yo [id number] at [redacted]w[redacted]d was admitted in Latent Laboron 04/02/2015. Patient had an uncomplicated labor course as follows:  Membrane Rupture Time/Date: 8:00 PM ,04/01/2015   Intrapartum Procedures: Episiotomy: None [1]                                         Lacerations:  1st degree [2];Perineal [11]  Patient had a delivery of a Viable infant. 04/03/2015  Information for the patient's newborn:  Udell, Blasingame [[id number]]  Delivery Method: Vaginal, Spontaneous Delivery (Filed from Delivery Summary)    Pateint had an uncomplicated postpartum course.  She is ambulating, tolerating a regular diet, passing flatus, and urinating well. Patient is discharged home in stable condition on No discharge date for patient encounter.Marland Kitchen    Physical exam  Filed Vitals:   04/03/15 2216 04/04/15 0620 04/04/15 1850 04/05/15 0705  BP: 109/57 115/79 110/75 114/68  Pulse: 79 73 78 69  Temp: 98.1 F (36.7 C) 97.4 F (36.3 C) 99.1 F (37.3 C) 98 F (36.7 C)   TempSrc: Oral Oral Oral   Resp: 18 16 17 18   Height:      Weight:      SpO2:       General: alert and cooperative Lochia: appropriate Uterine Fundus: firm Incision: Healing well with no significant drainage DVT Evaluation: No evidence of DVT seen on physical exam. Labs: Lab Results  Component Value Date   WBC 11.5* 04/04/2015   HGB 10.7* 04/04/2015   HCT 33.2* 04/04/2015   MCV 98.8 04/04/2015   PLT 208 04/04/2015   No flowsheet data found.  Discharge instruction: per After Visit Summary and "Baby and Me Booklet".  Medications:  Current facility-administered medications:  .  acetaminophen (TYLENOL) tablet 650 mg, 650 mg, Oral, Q4H PRN, Gerrit Heck, CNM .  benzocaine-Menthol (DERMOPLAST) 20-0.5 % topical spray 1 application, 1 application, Topical, PRN, Gerrit Heck, CNM, 1 application at 04/03/15 1809 .  witch hazel-glycerin (TUCKS) pad 1 application, 1 application, Topical, PRN **AND** dibucaine (NUPERCAINAL) 1 % rectal ointment 1 application, 1 application, Rectal, PRN, Gerrit Heck, CNM .  diphenhydrAMINE (BENADRYL) capsule 25 mg, 25  mg, Oral, Q6H PRN, Gerrit Heck, CNM .  ibuprofen (ADVIL,MOTRIN) tablet 600 mg, 600 mg, Oral, 4 times per day, Gerrit Heck, CNM, 600 mg at 04/05/15 0700 .  lanolin ointment, , Topical, PRN, Gerrit Heck, CNM .  lidocaine (PF) (XYLOCAINE) 1 % injection 30 mL, 30 mL, Subcutaneous, PRN, Nigel Bridgeman, CNM, 30 mL at 04/03/15 1430 .  ondansetron (ZOFRAN) tablet 4 mg, 4 mg, Oral, Q4H PRN **OR** ondansetron (ZOFRAN) injection 4 mg, 4 mg, Intravenous, Q4H PRN, Gerrit Heck, CNM .  oxyCODONE-acetaminophen (PERCOCET/ROXICET) 5-325 MG per tablet 1 tablet, 1 tablet, Oral, Q4H PRN, Gerrit Heck, CNM .  oxyCODONE-acetaminophen (PERCOCET/ROXICET) 5-325 MG per tablet 2 tablet, 2 tablet, Oral, Q4H PRN, Gerrit Heck, CNM .  prenatal multivitamin tablet 1 tablet, 1 tablet, Oral, Q1200, Gerrit Heck, CNM, 1 tablet at 04/04/15 1153 .  senna-docusate (Senokot-S)  tablet 2 tablet, 2 tablet, Oral, Q24H, Gerrit Heck, CNM, 2 tablet at 04/04/15 2318 .  simethicone (MYLICON) chewable tablet 80 mg, 80 mg, Oral, PRN, Gerrit Heck, CNM .  Tdap (BOOSTRIX) injection 0.5 mL, 0.5 mL, Intramuscular, Once, Gerrit Heck, CNM, 0.5 mL at 04/04/15 1057 .  zolpidem (AMBIEN) tablet 5 mg, 5 mg, Oral, QHS PRN, Gerrit Heck, CNM After Visit Meds:    Medication List    ASK your doctor about these medications        calcium carbonate 500 MG chewable tablet  Commonly known as:  TUMS - dosed in mg elemental calcium  Chew 2 tablets by mouth as needed for indigestion or heartburn.     doxycycline 50 MG capsule  Commonly known as:  VIBRAMYCIN  Take one tablet by mouth twice a day for 7 days     HYDROcodone-acetaminophen 5-325 MG tablet  Commonly known as:  NORCO/VICODIN  Take 1 tablet by mouth every 6 (six) hours as needed.     ibuprofen 600 MG tablet  Commonly known as:  ADVIL,MOTRIN  Take 1 tablet (600 mg total) by mouth every 6 (six) hours as needed.     ibuprofen 600 MG tablet  Commonly known as:  ADVIL,MOTRIN  Take 1 tablet (600 mg total) by mouth every 6 (six) hours as needed.     prenatal multivitamin Tabs tablet  Take 1 tablet by mouth daily.        Diet: routine diet  Activity: Advance as tolerated. Pelvic rest for 6 weeks.   Outpatient follow up:6 weeks Follow up Appt:No future appointments. Follow up visit: No Follow-up on file.  Postpartum contraception: Natural Family Planning  Newborn Data: Live born female  Birth Weight: 8 lb 14.7 oz (4045 g) APGAR: 8, 9  Baby Feeding: Breast Disposition:home with mother   04/05/2015 Everlynn Sagun, CNM      Postpartum Care After Vaginal Delivery  After you deliver your newborn (postpartum period), the usual stay in the hospital is 24 72 hours. If there were problems with your labor or delivery, or if you have other medical problems, you might be in the hospital longer.  While you are in the  hospital, you will receive help and instructions on how to care for yourself and your newborn during the postpartum period.  While you are in the hospital:  Be sure to tell your nurses if you have pain or discomfort, as well as where you feel the pain and what makes the pain worse.  If you had an incision made near your vagina (episiotomy) or if you had some tearing during delivery, the nurses may put  ice packs on your episiotomy or tear. The ice packs may help to reduce the pain and swelling.  If you are breastfeeding, you may feel uncomfortable contractions of your uterus for a couple of weeks. This is normal. The contractions help your uterus get back to normal size.  It is normal to have some bleeding after delivery.  For the first 1 3 days after delivery, the flow is red and the amount may be similar to a period.  It is common for the flow to start and stop.  In the first few days, you may pass some small clots. Let your nurses know if you begin to pass large clots or your flow increases.  Do not  flush blood clots down the toilet before having the nurse look at them.  During the next 3 10 days after delivery, your flow should become more watery and pink or brown-tinged in color.  Ten to fourteen days after delivery, your flow should be a small amount of yellowish-white discharge.  The amount of your flow will decrease over the first few weeks after delivery. Your flow may stop in 6 8 weeks. Most women have had their flow stop by 12 weeks after delivery.  You should change your sanitary pads frequently.  Wash your hands thoroughly with soap and water for at least 20 seconds after changing pads, using the toilet, or before holding or feeding your newborn.  You should feel like you need to empty your bladder within the first 6 8 hours after delivery.  In case you become weak, lightheaded, or faint, call your nurse before you get out of bed for the first time and before you take a  shower for the first time.  Within the first few days after delivery, your breasts may begin to feel tender and full. This is called engorgement. Breast tenderness usually goes away within 48 72 hours after engorgement occurs. You may also notice milk leaking from your breasts. If you are not breastfeeding, do not stimulate your breasts. Breast stimulation can make your breasts produce more milk.  Spending as much time as possible with your newborn is very important. During this time, you and your newborn can feel close and get to know each other. Having your newborn stay in your room (rooming in) will help to strengthen the bond with your newborn. It will give you time to get to know your newborn and become comfortable caring for your newborn.  Your hormones change after delivery. Sometimes the hormone changes can temporarily cause you to feel sad or tearful. These feelings should not last more than a few days. If these feelings last longer than that, you should talk to your caregiver.  If desired, talk to your caregiver about methods of family planning or contraception.  Talk to your caregiver about immunizations. Your caregiver may want you to have the following immunizations before leaving the hospital:  Tetanus, diphtheria, and pertussis (Tdap) or tetanus and diphtheria (Td) immunization. It is very important that you and your family (including grandparents) or others caring for your newborn are up-to-date with the Tdap or Td immunizations. The Tdap or Td immunization can help protect your newborn from getting ill.  Rubella immunization.  Varicella (chickenpox) immunization.  Influenza immunization. You should receive this annual immunization if you did not receive the immunization during your pregnancy. Document Released: 02/21/2007 Document Revised: 01/19/2012 Document Reviewed: 12/22/2011 [facility] Patient Information 2014 [place], [place].   Postpartum Depression and Baby Blues  The  postpartum period begins right after the birth of a baby. During this time, there is often a great amount of joy and excitement. It is also a time of considerable changes in the life of the parent(s). Regardless of how many times a mother gives birth, each child brings new challenges and dynamics to the family. It is not unusual to have feelings of excitement accompanied by confusing shifts in moods, emotions, and thoughts. All mothers are at risk of developing postpartum depression or the "baby blues." These mood changes can occur right after giving birth, or they may occur many months after giving birth. The baby blues or postpartum depression can be mild or severe. Additionally, postpartum depression can resolve rather quickly, or it can be a long-term condition. CAUSES Elevated hormones and their rapid decline are thought to be a main cause of postpartum depression and the baby blues. There are a number of hormones that radically change during and after pregnancy. Estrogen and progesterone usually decrease immediately after delivering your baby. The level of thyroid hormone and various cortisol steroids also rapidly drop. Other factors that play a major role in these changes include major life events and genetics.  RISK FACTORS If you have any of the following risks for the baby blues or postpartum depression, know what symptoms to watch out for during the postpartum period. Risk factors that may increase the likelihood of getting the baby blues or postpartum depression include: 1. Havinga personal or family history of depression. 2. Having depression while being pregnant. 3. Having premenstrual or oral contraceptive-associated mood issues. 4. Having exceptional life stress. 5. Having marital conflict. 6. Lacking a social support network. 7. Having a baby with special needs. 8. Having health problems such as diabetes. SYMPTOMS Baby blues symptoms include:  Brief fluctuations in mood, such as  going from extreme happiness to sadness.  Decreased concentration.  Difficulty sleeping.  Crying spells, tearfulness.  Irritability.  Anxiety. Postpartum depression symptoms typically begin within the first month after giving birth. These symptoms include:  Difficulty sleeping or excessive sleepiness.  Marked weight loss.  Agitation.  Feelings of worthlessness.  Lack of interest in activity or food. Postpartum psychosis is a very concerning condition and can be dangerous. Fortunately, it is rare. Displaying any of the following symptoms is cause for immediate medical attention. Postpartum psychosis symptoms include:  Hallucinations and delusions.  Bizarre or disorganized behavior.  Confusion or disorientation. DIAGNOSIS  A diagnosis is made by an evaluation of your symptoms. There are no medical or lab tests that lead to a diagnosis, but there are various questionnaires that a caregiver may use to identify those with the baby blues, postpartum depression, or psychosis. Often times, a screening tool called the [place] Postnatal Depression Scale is used to diagnose depression in the postpartum period.  TREATMENT The baby blues usually goes away on its own in 1 to 2 weeks. Social support is often all that is needed. You should be encouraged to get adequate sleep and rest. Occasionally, you may be given medicines to help you sleep.  Postpartum depression requires treatment as it can last several months or longer if it is not treated. Treatment may include individual or group therapy, medicine, or both to address any social, physiological, and psychological factors that may play a role in the depression. Regular exercise, a healthy diet, rest, and social support may also be strongly recommended.  Postpartum psychosis is more serious and needs treatment right away. Hospitalization is often needed. HOME CARE INSTRUCTIONS  Get as much rest as you can. Nap when the baby  sleeps.  Exercise regularly. Some women find yoga and walking to be beneficial.  Eat a balanced and nourishing diet.  Do little things that you enjoy. Have a cup of tea, take a bubble bath, read your favorite magazine, or listen to your favorite music.  Avoid alcohol.  Ask for help with household chores, cooking, grocery shopping, or running errands as needed. Do not try to do everything.  Talk to people close to you about how you are feeling. Get support from your partner, family members, friends, or other new moms.  Try to stay positive in how you think. Think about the things you are grateful for.  Do not spend a lot of time alone.  Only take medicine as directed by your caregiver.  Keep all your postpartum appointments.  Let your caregiver know if you have any concerns. SEEK MEDICAL CARE IF: You are having a reaction or problems with your medicine. SEEK IMMEDIATE MEDICAL CARE IF:  You have suicidal feelings.  You feel you may harm the baby or someone else. Document Released: 01/29/2004 Document Revised: 07/19/2011 Document Reviewed: 03/02/2011 [facility] Patient Information 2014 [place], [place].     Breastfeeding Deciding to breastfeed is one of the best choices you can make for you and your baby. A change in hormones during pregnancy causes your breast tissue to grow and increases the number and size of your milk ducts. These hormones also allow proteins, sugars, and fats from your blood supply to make breast milk in your milk-producing glands. Hormones prevent breast milk from being released before your baby is born as well as prompt milk flow after birth. Once breastfeeding has begun, thoughts of your baby, as well as his or her sucking or crying, can stimulate the release of milk from your milk-producing glands.  BENEFITS OF BREASTFEEDING For Your Baby  Your first milk (colostrum) helps your baby's digestive system function better.   There are antibodies in your milk  that help your baby fight off infections.   Your baby has a lower incidence of asthma, allergies, and sudden infant death syndrome.   The nutrients in breast milk are better for your baby than infant formulas and are designed uniquely for your baby's needs.   Breast milk improves your baby's brain development.   Your baby is less likely to develop other conditions, such as childhood obesity, asthma, or type 2 diabetes mellitus.  For You   Breastfeeding helps to create a very special bond between you and your baby.   Breastfeeding is convenient. Breast milk is always available at the correct temperature and costs nothing.   Breastfeeding helps to burn calories and helps you lose the weight gained during pregnancy.   Breastfeeding makes your uterus contract to its prepregnancy size faster and slows bleeding (lochia) after you give birth.   Breastfeeding helps to lower your risk of developing type 2 diabetes mellitus, osteoporosis, and breast or ovarian cancer later in life. SIGNS THAT YOUR BABY IS HUNGRY Early Signs of Hunger  Increased alertness or activity.  Stretching.  Movement of the head from side to side.  Movement of the head and opening of the mouth when the corner of the mouth or cheek is stroked (rooting).  Increased sucking sounds, smacking lips, cooing, sighing, or squeaking.  Hand-to-mouth movements.  Increased sucking of fingers or hands. Late Signs of Hunger  Fussing.  Intermittent crying. Extreme Signs of Hunger Signs of extreme hunger  will require calming and consoling before your baby will be able to breastfeed successfully. Do not wait for the following signs of extreme hunger to occur before you initiate breastfeeding:   Restlessness.  A loud, strong cry.   Screaming.   BREASTFEEDING BASICS Breastfeeding Initiation  Find a comfortable place to sit or lie down, with your neck and back well supported.  Place a pillow or rolled up  blanket under your baby to bring him or her to the level of your breast (if you are seated). Nursing pillows are specially designed to help support your arms and your baby while you breastfeed.  Make sure that your baby's abdomen is facing your abdomen.   Gently massage your breast. With your fingertips, massage from your chest wall toward your nipple in a circular motion. This encourages milk flow. You may need to continue this action during the feeding if your milk flows slowly.  Support your breast with 4 fingers underneath and your thumb above your nipple. Make sure your fingers are well away from your nipple and your baby's mouth.   Stroke your baby's lips gently with your finger or nipple.   When your baby's mouth is open wide enough, quickly bring your baby to your breast, placing your entire nipple and as much of the colored area around your nipple (areola) as possible into your baby's mouth.   More areola should be visible above your baby's upper lip than below the lower lip.   Your baby's tongue should be between his or her lower gum and your breast.   Ensure that your baby's mouth is correctly positioned around your nipple (latched). Your baby's lips should create a seal on your breast and be turned out (everted).  It is common for your baby to suck about 2-3 minutes in order to start the flow of breast milk. Latching Teaching your baby how to latch on to your breast properly is very important. An improper latch can cause nipple pain and decreased milk supply for you and poor weight gain in your baby. Also, if your baby is not latched onto your nipple properly, he or she may swallow some air during feeding. This can make your baby fussy. Burping your baby when you switch breasts during the feeding can help to get rid of the air. However, teaching your baby to latch on properly is still the best way to prevent fussiness from swallowing air while breastfeeding. Signs that your  baby has successfully latched on to your nipple:    Silent tugging or silent sucking, without causing you pain.   Swallowing heard between every 3-4 sucks.    Muscle movement above and in front of his or her ears while sucking.  Signs that your baby has not successfully latched on to nipple:   Sucking sounds or smacking sounds from your baby while breastfeeding.  Nipple pain. If you think your baby has not latched on correctly, slip your finger into the corner of your baby's mouth to break the suction and place it between your baby's gums. Attempt breastfeeding initiation again. Signs of Successful Breastfeeding Signs from your baby:   A gradual decrease in the number of sucks or complete cessation of sucking.   Falling asleep.   Relaxation of his or her body.   Retention of a small amount of milk in his or her mouth.   Letting go of your breast by himself or herself. Signs from you:  Breasts that have increased in firmness, weight,  and size 1-3 hours after feeding.   Breasts that are softer immediately after breastfeeding.  Increased milk volume, as well as a change in milk consistency and color by the fifth day of breastfeeding.   Nipples that are not sore, cracked, or bleeding. Signs That Your Pecola Leisure is Getting Enough Milk  Wetting at least 3 diapers in a 24-hour period. The urine should be clear and pale yellow by age 39 days.  At least 3 stools in a 24-hour period by age 39 days. The stool should be soft and yellow.  At least 3 stools in a 24-hour period by age 38 days. The stool should be seedy and yellow.  No loss of weight greater than 10% of birth weight during the first 12 days of age.  Average weight gain of 4-7 ounces (113-198 g) per week after age 94 days.  Consistent daily weight gain by age 39 days, without weight loss after the age of 2 weeks. After a feeding, your baby may spit up a small amount. This is common. BREASTFEEDING FREQUENCY AND  DURATION Frequent feeding will help you make more milk and can prevent sore nipples and breast engorgement. Breastfeed when you feel the need to reduce the fullness of your breasts or when your baby shows signs of hunger. This is called "breastfeeding on demand." Avoid introducing a pacifier to your baby while you are working to establish breastfeeding (the first 4-6 weeks after your baby is born). After this time you may choose to use a pacifier. Research has shown that pacifier use during the first year of a baby's life decreases the risk of sudden infant death syndrome (SIDS). Allow your baby to feed on each breast as long as he or she wants. Breastfeed until your baby is finished feeding. When your baby unlatches or falls asleep while feeding from the first breast, offer the second breast. Because newborns are often sleepy in the first few weeks of life, you may need to awaken your baby to get him or her to feed. Breastfeeding times will vary from baby to baby. However, the following rules can serve as a guide to help you ensure that your baby is properly fed:  Newborns (babies 93 weeks of age or younger) may breastfeed every 1-3 hours.  Newborns should not go longer than 3 hours during the day or 5 hours during the night without breastfeeding.  You should breastfeed your baby a minimum of 8 times in a 24-hour period until you begin to introduce solid foods to your baby at around 19 months of age. BREAST MILK PUMPING Pumping and storing breast milk allows you to ensure that your baby is exclusively fed your breast milk, even at times when you are unable to breastfeed. This is especially important if you are going back to work while you are still breastfeeding or when you are not able to be present during feedings. Your lactation consultant can give you guidelines on how long it is safe to store breast milk.  A breast pump is a machine that allows you to pump milk from your breast into a sterile bottle.  The pumped breast milk can then be stored in a refrigerator or freezer. Some breast pumps are operated by hand, while others use electricity. Ask your lactation consultant which type will work best for you. Breast pumps can be purchased, but some hospitals and breastfeeding support groups lease breast pumps on a monthly basis. A lactation consultant can teach you how to hand express  breast milk, if you prefer not to use a pump.  CARING FOR YOUR BREASTS WHILE YOU BREASTFEED Nipples can become dry, cracked, and sore while breastfeeding. The following recommendations can help keep your breasts moisturized and healthy:  Avoid using soap on your nipples.   Wear a supportive bra. Although not required, special nursing bras and tank tops are designed to allow access to your breasts for breastfeeding without taking off your entire bra or top. Avoid wearing underwire-style bras or extremely tight bras.  Air dry your nipples for 3-69minutes after each feeding.   Use only cotton bra pads to absorb leaked breast milk. Leaking of breast milk between feedings is normal.   Use lanolin on your nipples after breastfeeding. Lanolin helps to maintain your skin's normal moisture barrier. If you use pure lanolin, you do not need to wash it off before feeding your baby again. Pure lanolin is not toxic to your baby. You may also hand express a few drops of breast milk and gently massage that milk into your nipples and allow the milk to air dry. In the first few weeks after giving birth, some women experience extremely full breasts (engorgement). Engorgement can make your breasts feel heavy, warm, and tender to the touch. Engorgement peaks within 3-5 days after you give birth. The following recommendations can help ease engorgement:  Completely empty your breasts while breastfeeding or pumping. You may want to start by applying warm, moist heat (in the shower or with warm water-soaked hand towels) just before feeding or  pumping. This increases circulation and helps the milk flow. If your baby does not completely empty your breasts while breastfeeding, pump any extra milk after he or she is finished.  Wear a snug bra (nursing or regular) or tank top for 1-2 days to signal your body to slightly decrease milk production.  Apply ice packs to your breasts, unless this is too uncomfortable for you.  Make sure that your baby is latched on and positioned properly while breastfeeding. If engorgement persists after 48 hours of following these recommendations, contact your health care provider or a Advertising [employer]. OVERALL HEALTH CARE RECOMMENDATIONS WHILE BREASTFEEDING  Eat healthy foods. Alternate between meals and snacks, eating 3 of each per day. Because what you eat affects your breast milk, some of the foods may make your baby more irritable than usual. Avoid eating these foods if you are sure that they are negatively affecting your baby.  Drink milk, fruit juice, and water to satisfy your thirst (about 10 glasses a day).   Rest often, relax, and continue to take your prenatal vitamins to prevent fatigue, stress, and anemia.  Continue breast self-awareness checks.  Avoid chewing and smoking tobacco.  Avoid alcohol and drug use. Some medicines that may be harmful to your baby can pass through breast milk. It is important to ask your health care provider before taking any medicine, including all over-the-counter and prescription medicine as well as vitamin and herbal supplements. It is possible to become pregnant while breastfeeding. If birth control is desired, ask your health care provider about options that will be safe for your baby. SEEK MEDICAL CARE IF:   You feel like you want to stop breastfeeding or have become frustrated with breastfeeding.  You have painful breasts or nipples.  Your nipples are cracked or bleeding.  Your breasts are red, tender, or warm.  You have a swollen area on either  breast.  You have a fever or chills.  You have nausea  or vomiting.  You have drainage other than breast milk from your nipples.  Your breasts do not become full before feedings by the fifth day after you give birth.  You feel sad and depressed.  Your baby is too sleepy to eat well.  Your baby is having trouble sleeping.   Your baby is wetting less than 3 diapers in a 24-hour period.  Your baby has less than 3 stools in a 24-hour period.  Your baby's skin or the white part of his or her eyes becomes yellow.   Your baby is not gaining weight by 30 days of age. SEEK IMMEDIATE MEDICAL CARE IF:   Your baby is overly tired (lethargic) and does not want to wake up and feed.  Your baby develops an unexplained fever. Document Released: 04/26/2005 Document Revised: 05/01/2013 Document Reviewed: 10/18/2012 [facility] Patient Information 2015 [place], [place]. This information is not intended to replace advice given to you by your health care provider. Make sure you discuss any questions you have with your health care provider.
